# Patient Record
Sex: Female | Born: 1980 | Race: White | Hispanic: No | Marital: Married | Smoking: Former smoker
Health system: Southern US, Community
[De-identification: ages and names within clinical notes are randomized; demographics above are authoritative.]

## PROBLEM LIST (undated history)

## (undated) DIAGNOSIS — N12 Tubulo-interstitial nephritis, not specified as acute or chronic: Secondary | ICD-10-CM

## (undated) DIAGNOSIS — K429 Umbilical hernia without obstruction or gangrene: Secondary | ICD-10-CM

## (undated) DIAGNOSIS — F32A Depression, unspecified: Secondary | ICD-10-CM

## (undated) DIAGNOSIS — F329 Major depressive disorder, single episode, unspecified: Secondary | ICD-10-CM

## (undated) DIAGNOSIS — G44209 Tension-type headache, unspecified, not intractable: Secondary | ICD-10-CM

## (undated) HISTORY — PX: LASIK: SHX215

## (undated) HISTORY — DX: Umbilical hernia without obstruction or gangrene: K42.9

## (undated) HISTORY — PX: EYE SURGERY: SHX253

---

## 2006-01-02 ENCOUNTER — Ambulatory Visit: Payer: Self-pay | Admitting: Family Medicine

## 2006-01-30 ENCOUNTER — Ambulatory Visit: Payer: Self-pay | Admitting: Family Medicine

## 2006-04-03 ENCOUNTER — Ambulatory Visit: Payer: Self-pay | Admitting: Family Medicine

## 2006-04-03 LAB — CONVERTED CEMR LAB: Hemoglobin: 14.4 g/dL (ref 12.0–15.0)

## 2006-11-02 ENCOUNTER — Ambulatory Visit (HOSPITAL_COMMUNITY): Admission: RE | Admit: 2006-11-02 | Discharge: 2006-11-02 | Payer: Self-pay | Admitting: Internal Medicine

## 2006-11-09 ENCOUNTER — Other Ambulatory Visit: Admission: RE | Admit: 2006-11-09 | Discharge: 2006-11-09 | Payer: Self-pay | Admitting: Internal Medicine

## 2006-11-26 ENCOUNTER — Ambulatory Visit (HOSPITAL_COMMUNITY): Admission: RE | Admit: 2006-11-26 | Discharge: 2006-11-26 | Payer: Self-pay | Admitting: Internal Medicine

## 2006-11-28 ENCOUNTER — Emergency Department (HOSPITAL_COMMUNITY): Admission: EM | Admit: 2006-11-28 | Discharge: 2006-11-28 | Payer: Self-pay | Admitting: Emergency Medicine

## 2008-05-01 ENCOUNTER — Other Ambulatory Visit: Admission: RE | Admit: 2008-05-01 | Discharge: 2008-05-01 | Payer: Self-pay | Admitting: Internal Medicine
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
## 2010-10-17 LAB — RPR: RPR: NONREACTIVE

## 2010-10-17 LAB — ABO/RH: RH Type: POSITIVE

## 2010-10-17 LAB — GC/CHLAMYDIA PROBE AMP, GENITAL: Chlamydia: NEGATIVE

## 2010-10-17 LAB — CBC: Hemoglobin: 14.1 g/dL (ref 12.0–16.0)

## 2010-10-17 LAB — ANTIBODY SCREEN: Antibody Screen: NEGATIVE

## 2010-10-17 LAB — HIV ANTIBODY (ROUTINE TESTING W REFLEX): HIV: NONREACTIVE

## 2010-12-05 LAB — CBC
HCT: 42.3
Hemoglobin: 14.5
MCHC: 34.4
MCV: 90.5
Platelets: 265
RBC: 4.67
RDW: 13.3
WBC: 8.2

## 2010-12-05 LAB — RAPID URINE DRUG SCREEN, HOSP PERFORMED
Amphetamines: NOT DETECTED
Barbiturates: NOT DETECTED
Benzodiazepines: NOT DETECTED
Cocaine: POSITIVE — AB
Opiates: NOT DETECTED
Tetrahydrocannabinol: NOT DETECTED

## 2010-12-05 LAB — BASIC METABOLIC PANEL
BUN: 7
CO2: 27
Calcium: 8.9
Creatinine, Ser: 0.61
GFR calc Af Amer: >60
Glucose, Bld: 82
Sodium: 137

## 2010-12-05 LAB — DIFFERENTIAL
Basophils Absolute: 0
Basophils Relative: 0
Eosinophils Absolute: 0.1
Eosinophils Relative: 2
Lymphocytes Relative: 15
Lymphs Abs: 1.2
Monocytes Absolute: 0.4
Monocytes Relative: 5
Neutro Abs: 6.4
Neutrophils Relative %: 78 — ABNORMAL HIGH

## 2010-12-05 LAB — POCT CARDIAC MARKERS
CKMB, poc: <1 — ABNORMAL LOW
Myoglobin, poc: 44.9
Operator id: 2725
Troponin i, poc: <0.05

## 2010-12-05 LAB — BASIC METABOLIC PANEL WITH GFR
Chloride: 105
GFR calc non Af Amer: >60
Potassium: 3.9

## 2010-12-05 LAB — PREGNANCY, URINE: Preg Test, Ur: NEGATIVE

## 2010-12-05 LAB — D-DIMER, QUANTITATIVE: D-Dimer, Quant: 0.29

## 2011-02-25 DIAGNOSIS — K429 Umbilical hernia without obstruction or gangrene: Secondary | ICD-10-CM

## 2011-02-25 HISTORY — DX: Umbilical hernia without obstruction or gangrene: K42.9

## 2011-03-05 DIAGNOSIS — Z331 Pregnant state, incidental: Secondary | ICD-10-CM

## 2011-04-15 LAB — STREP B DNA PROBE: GBS: NEGATIVE

## 2011-04-21 ENCOUNTER — Encounter: Payer: Self-pay | Admitting: Registered Nurse

## 2011-04-23 ENCOUNTER — Encounter (INDEPENDENT_AMBULATORY_CARE_PROVIDER_SITE_OTHER): Payer: BC Managed Care – PPO | Admitting: Registered Nurse

## 2011-04-23 DIAGNOSIS — Z331 Pregnant state, incidental: Secondary | ICD-10-CM

## 2011-04-30 ENCOUNTER — Encounter (INDEPENDENT_AMBULATORY_CARE_PROVIDER_SITE_OTHER): Payer: BC Managed Care – PPO | Admitting: Registered Nurse

## 2011-04-30 DIAGNOSIS — Z331 Pregnant state, incidental: Secondary | ICD-10-CM

## 2011-05-08 ENCOUNTER — Encounter (INDEPENDENT_AMBULATORY_CARE_PROVIDER_SITE_OTHER): Payer: BC Managed Care – PPO

## 2011-05-08 DIAGNOSIS — Z331 Pregnant state, incidental: Secondary | ICD-10-CM

## 2011-05-15 ENCOUNTER — Encounter (INDEPENDENT_AMBULATORY_CARE_PROVIDER_SITE_OTHER): Payer: BC Managed Care – PPO | Admitting: Obstetrics and Gynecology

## 2011-05-15 DIAGNOSIS — Z331 Pregnant state, incidental: Secondary | ICD-10-CM

## 2011-05-18 ENCOUNTER — Inpatient Hospital Stay (HOSPITAL_COMMUNITY)
Admission: AD | Admit: 2011-05-18 | Discharge: 2011-05-19 | DRG: 373 | Disposition: A | Discharge status: Home / Self Care | Payer: BC Managed Care – PPO | Source: Ambulatory Visit | Attending: Obstetrics and Gynecology | Admitting: Obstetrics and Gynecology

## 2011-05-18 ENCOUNTER — Encounter (HOSPITAL_COMMUNITY): Payer: Self-pay

## 2011-05-18 DIAGNOSIS — IMO0001 Reserved for inherently not codable concepts without codable children: Secondary | ICD-10-CM

## 2011-05-18 HISTORY — DX: Depression, unspecified: F32.A

## 2011-05-18 HISTORY — DX: Tubulo-interstitial nephritis, not specified as acute or chronic: N12

## 2011-05-18 HISTORY — DX: Tension-type headache, unspecified, not intractable: G44.209

## 2011-05-18 HISTORY — DX: Major depressive disorder, single episode, unspecified: F32.9

## 2011-05-18 MED ORDER — OXYCODONE-ACETAMINOPHEN 5-325 MG PO TABS: 1.0000 | ORAL_TABLET | ORAL | Status: DC | PRN

## 2011-05-18 MED ORDER — BENZOCAINE-MENTHOL 20-0.5 % EX AERO
INHALATION_SPRAY | CUTANEOUS | Status: AC
  Filled 2011-05-18: qty 56

## 2011-05-18 MED ORDER — SENNOSIDES-DOCUSATE SODIUM 8.6-50 MG PO TABS: 2.0000 | ORAL_TABLET | Freq: Every day | ORAL | Status: DC

## 2011-05-18 MED ORDER — PRENATAL MULTIVITAMIN CH
1.0000 | ORAL_TABLET | Freq: Every day | ORAL | Status: DC
Start: 2011-05-18 — End: 2011-05-19
  Administered 2011-05-18: 1 via ORAL
  Filled 2011-05-18: qty 1

## 2011-05-18 MED ORDER — BENZOCAINE-MENTHOL 20-0.5 % EX AERO: 1.0000 "application " | INHALATION_SPRAY | CUTANEOUS | Status: DC | PRN

## 2011-05-18 MED ORDER — ONDANSETRON HCL 4 MG/2ML IJ SOLN: 4.0000 mg | INTRAMUSCULAR | Status: DC | PRN

## 2011-05-18 MED ORDER — LANOLIN HYDROUS EX OINT: TOPICAL_OINTMENT | CUTANEOUS | Status: DC | PRN

## 2011-05-18 MED ORDER — DIBUCAINE 1 % RE OINT: 1.0000 "application " | TOPICAL_OINTMENT | RECTAL | Status: DC | PRN

## 2011-05-18 MED ORDER — ZOLPIDEM TARTRATE 5 MG PO TABS: 5.0000 mg | ORAL_TABLET | Freq: Every evening | ORAL | Status: DC | PRN

## 2011-05-18 MED ORDER — OXYTOCIN 10 UNIT/ML IJ SOLN
INTRAMUSCULAR | Status: AC
  Administered 2011-05-18: 20 [IU]
  Filled 2011-05-18: qty 2

## 2011-05-18 MED ORDER — SIMETHICONE 80 MG PO CHEW: 80.0000 mg | CHEWABLE_TABLET | ORAL | Status: DC | PRN

## 2011-05-18 MED ORDER — WITCH HAZEL-GLYCERIN EX PADS: 1.0000 "application " | MEDICATED_PAD | CUTANEOUS | Status: DC | PRN

## 2011-05-18 MED ORDER — DIPHENHYDRAMINE HCL 25 MG PO CAPS: 25.0000 mg | ORAL_CAPSULE | Freq: Four times a day (QID) | ORAL | Status: DC | PRN

## 2011-05-18 MED ORDER — ONDANSETRON HCL 4 MG PO TABS: 4.0000 mg | ORAL_TABLET | ORAL | Status: DC | PRN

## 2011-05-18 MED ORDER — IBUPROFEN 600 MG PO TABS
600.0000 mg | ORAL_TABLET | Freq: Four times a day (QID) | ORAL | Status: DC
  Administered 2011-05-18 – 2011-05-19 (×4): 600 mg via ORAL
  Filled 2011-05-18 (×4): qty 1

## 2011-05-18 MED ORDER — TETANUS-DIPHTH-ACELL PERTUSSIS 5-2.5-18.5 LF-MCG/0.5 IM SUSP: 0.5000 mL | Freq: Once | INTRAMUSCULAR | Status: DC

## 2011-05-18 MED ORDER — LIDOCAINE HCL (PF) 1 % IJ SOLN
INTRAMUSCULAR | Status: AC
  Filled 2011-05-18: qty 30

## 2011-05-18 NOTE — H&P (Signed)
Holly Wright is a 31 y.o. female presenting for regular uterine contractions and SROM.  Pt presents with c/o SROM at 0200 3/24 with clear fluid and subsequent onset of regular uterine contractions shortly after.  Upon arrival to MAU, pt breathing well with UCs and involuntarily pushing with UCs.  Fetal vertex noted at vaginal introitus.    Maternal Medical History:  Reason for admission: Reason for admission: rupture of membranes and contractions.  Contractions: Onset was 3-5 hours ago.   Frequency: regular.   Perceived severity is moderate.    Fetal activity: Perceived fetal activity is normal.   Last perceived fetal movement was within the past hour.    Prenatal complications: no prenatal complications Prenatal Complications - Diabetes: none.   Hx Present Pregnancy: Pt initially established care at [redacted]wks gestation at [facility].  Pt underwent 1st trimester screening which returned with neg results.  Pt transferred care to [facility] at 29wks in order to pursue option for waterbirth.  Pt had abn 1hr glucola with value of 144.  Pt did undergo 3hr GTT but results are not available.  GBS screening negative at 35wks.  Remainder of prenatal course unremarkable.  OB History    Grav Para Term Preterm Abortions TAB SAB Ect Mult Living   1 1 1       1      Past Medical History  Diagnosis Date  . Pyelonephritis   . Tension headache   . Depression    Past Surgical History  Procedure Date  . Lasik    Family History: family history includes Cancer in her paternal grandfather; Heart disease in her maternal grandfather; and Hypertension in her mother. Social History:  reports that she quit smoking about 8 months ago. She has never used smokeless tobacco. She reports that she drinks alcohol. She reports that she uses illicit drugs (Marijuana). Last Marijuana use 4 mos prior to LMP per pt and only used ETOH socially prior to positive UPT.   Review of Systems  Constitutional:  Negative.   HENT: Negative.   Eyes: Negative.   Respiratory: Negative.   Cardiovascular: Negative.   Gastrointestinal: Negative.   Genitourinary: Negative.   Musculoskeletal: Negative.   Skin: Negative.   Neurological: Negative.   Endo/Heme/Allergies: Negative.   Psychiatric/Behavioral: Negative.     Dilation:  (appears to be complete, visualize fetal hair at perineum) Effacement (%):  (pt. unable to lay down to have sve) Blood pressure 121/81, pulse 97, temperature 98 F (36.7 C), temperature source Axillary, resp. rate 20, last menstrual period 08/12/2010, SpO2 98.00%, unknown if currently breastfeeding. Maternal Exam:  Uterine Assessment: Contraction strength is firm.  Contraction frequency is regular.   Abdomen: Patient reports no abdominal tenderness. Fundal height is 40.   Estimated fetal weight is 7.   Fetal presentation: vertex  Introitus: Normal vulva. Normal vagina.  Ferning test: not done.  Nitrazine test: not done. Amniotic fluid character: clear.  Pelvis: adequate for delivery.      Fetal Exam Fetal Monitor Review: Mode: ultrasound.   Baseline rate: 130.  Variability: minimal (<5 bpm).   Pattern: no accelerations.    Fetal State Assessment: Category II - tracings are indeterminate.    Unable to obtain continuous EFM tracing due to pt position (hands/knees) Physical Exam  Constitutional: She is oriented to person, place, and time. She appears well-developed and well-nourished.  HENT:  Head: Normocephalic and atraumatic.  Right Ear: External ear normal.  Left Ear: External ear normal.  Nose: Nose normal.  Eyes: Conjunctivae are normal. Pupils are equal, round, and reactive to light.  Neck: Normal range of motion. Neck supple. No thyromegaly present.  Cardiovascular: Normal rate, regular rhythm and intact distal pulses.   Respiratory: Effort normal and breath sounds normal.  GI: Soft. Bowel sounds are normal.  Genitourinary: Vagina normal and uterus  normal.  Musculoskeletal: Normal range of motion.  Neurological: She is alert and oriented to person, place, and time. She has normal reflexes.  Skin: Skin is warm and dry.  Psychiatric: She has a normal mood and affect. Her behavior is normal.    Prenatal labs: ABO, Rh: B/Positive/-- (08/23 0000) Antibody: Negative (08/23 0000) Rubella: Immune (08/23 0000) RPR: Nonreactive (08/23 0000)  HBsAg: Negative (08/23 0000)  HIV: Non-reactive (08/23 0000)  GBS: Negative (02/19 0000)   Assessment/Plan: IUP at [redacted]w[redacted]d 2nd stage of labor  Pt delivered in MAU.  See Delivery record.   Admit to Mother-Baby from MAU. Routine post-partum admission orders.     Isom Kochan O. 05/18/2011, 9:46 PM

## 2011-05-18 NOTE — Progress Notes (Signed)
Patient ID: Holly Wright, female   DOB: January 03, 1981, 31 y.o.   MRN: [id number] Delivery Note At 5:06 AM a viable female was delivered via Vaginal, Spontaneous Delivery (Presentation: LOA;Vertex). Pt on hands and knees.  No nuchal cord noted.  No difficulty with shoulders.  Infant with spontaneous lusty cry.  Infant dried and placed on maternal chest after pt resumed semifowlers position.  Cord doubly clamped after cessation of pulsation (pt request) and cord cut by FOB.    APGAR: 8, 9; weight 7 lb 3 oz (3260 g).   Placenta status: Intact, Spontaneous.  Cord: 3 vessels with the following complications: None.  Cord pH: Not obtained.  Anesthesia: 1% xylocaine local Episiotomy: None Lacerations: 1st degree bilateral labial lacerations Suture Repair: 3.0 vicryl Est. Blood Loss (mL): 350  Mom to postpartum.  Baby to nursery-stable.  Donn Zanetti O. 05/18/2011, 10:03 PM
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
## 2011-05-18 NOTE — Progress Notes (Addendum)
Patient's doula has came in before her to register her in. Patient arrived at 0500am and was brought straight in to rm 8. She is on hands and knees and pushing, blood and stool at her perineum. Nona smith cnm called to attend delivery. fht attained intermittently, unable to trace contraction with toco. Palpated about every 1-16minutes apart. Patient states that she ruptured her membranes at 0200am with clear fluids. She was unable to answer history questions at this time. She states that this pregnancy was uncomplicated, have intended to do a waterbirth, will breastfeed and wants no medications.

## 2011-05-18 NOTE — Progress Notes (Signed)
Ist degree lacerations repair by Elsie Ra cnm

## 2011-05-19 LAB — CBC
HCT: 32.7 % — ABNORMAL LOW (ref 36.0–46.0)
Hemoglobin: 10.9 g/dL — ABNORMAL LOW (ref 12.0–15.0)
MCHC: 33.3 g/dL (ref 30.0–36.0)

## 2011-05-19 MED ORDER — IBUPROFEN 600 MG PO TABS: 600.0000 mg | ORAL_TABLET | Freq: Four times a day (QID) | ORAL | Status: AC | PRN

## 2011-05-19 NOTE — Discharge Instructions (Signed)
Breastfeeding BENEFITS OF BREASTFEEDING For the baby  The first milk (colostrum) helps the baby's digestive system function better.   There are antibodies from the mother in the milk that help the baby fight off infections.   The baby has a lower incidence of asthma, allergies, and SIDS (sudden infant death syndrome).   The nutrients in breast milk are better than formulas for the baby and helps the baby's brain grow better.   Babies who breastfeed have less gas, colic, and constipation.  For the mother  Breastfeeding helps develop a very special bond between mother and baby.   It is more convenient, always available at the correct temperature and cheaper than formula feeding.   It burns calories in the mother and helps with losing weight that was gained during pregnancy.  Vaginal Delivery Care After Change your pad on each trip to the bathroom.  Wipe gently with toilet paper during your hospital stay. Always wipe from front to back. A spray bottle with warm tap water could also be used or a towelette if available.  Place your soiled pad and toilet paper in a bathroom wastebasket with a plastic bag liner.  During your hospital stay, save any clots. If you pass a clot while on the toilet, do not flush it. Also, if your vaginal flow seems excessive to you, notify nursing personnel.  The first time you get out of bed after delivery, wait for assistance from a nurse. Do not get up alone at any time if you feel weak or dizzy.  Bend and extend your ankles forcefully so that you feel the calves of your legs get hard. Do this 6 times every hour when you are in bed and awake.  Do not sit with one foot under you, dangle your legs over the edge of the bed, or maintain a position that hinders the circulation in your legs.  Many women experience after pains for 2 to 3 days after delivery. These after pains are mild uterine contractions. Ask the nurse for a pain medication if you need something for  this. Sometimes breastfeeding stimulates after pains; if you find this to be true, ask for the medication  -  hour before the next feeding.  For you and your infant's protection, do not go beyond the door(s) of the obstetric unit. Do not carry your baby in your arms in the hallway. When taking your baby to and from your room, put your baby in the bassinet and push the bassinet.  Mothers may have their babies in their room as much as they desire.  Document Released: 02/08/2000 Document Revised: 01/30/2011 Document Reviewed: 01/08/2007 [facility] Patient Information 2012 [place], [place].  It makes the uterus contract back down to normal size faster and slows bleeding following delivery.   Breastfeeding mothers have a lower risk of developing breast cancer.  NURSE FREQUENTLY  A healthy, full-term baby may breastfeed as often as every hour or space his or her feedings to every 3 hours.   How often to nurse will vary from baby to baby. Watch your baby for signs of hunger, not the clock.   Nurse as often as the baby requests, or when you feel the need to reduce the fullness of your breasts.   Awaken the baby if it has been 3 to 4 hours since the last feeding.   Frequent feeding will help the mother make more milk and will prevent problems like sore nipples and engorgement of the breasts.  BABY'S POSITION  AT THE BREAST  Whether lying down or sitting, be sure that the baby's tummy is facing your tummy.   Support the breast with 4 fingers underneath the breast and the thumb above. Make sure your fingers are well away from the nipple and baby's mouth.   Stroke the baby's lips and cheek closest to the breast gently with your finger or nipple.   When the baby's mouth is open wide enough, place all of your nipple and as much of the dark area around the nipple as possible into your baby's mouth.   Pull the baby in close so the tip of the nose and the baby's cheeks touch the breast during the feeding.   FEEDINGS  The length of each feeding varies from baby to baby and from feeding to feeding.   The baby must suck about 2 to 3 minutes for your milk to get to him or her. This is called a "let down." For this reason, allow the baby to feed on each breast as long as he or she wants. Your baby will end the feeding when he or she has received the right balance of nutrients.   To break the suction, put your finger into the corner of the baby's mouth and slide it between his or her gums before removing your breast from his or her mouth. This will help prevent sore nipples.  REDUCING BREAST ENGORGEMENT  In the first week after your baby is born, you may experience signs of breast engorgement. When breasts are engorged, they feel heavy, warm, full, and may be tender to the touch. You can reduce engorgement if you:   Nurse frequently, every 2 to 3 hours. Mothers who breastfeed early and often have fewer problems with engorgement.   Place light ice packs on your breasts between feedings. This reduces swelling. Wrap the ice packs in a lightweight towel to protect your skin.   Apply moist hot packs to your breast for 5 to 10 minutes before each feeding. This increases circulation and helps the milk flow.   Gently massage your breast before and during the feeding.   Make sure that the baby empties at least one breast at every feeding before switching sides.   Use a breast pump to empty the breasts if your baby is sleepy or not nursing well. You may also want to pump if you are returning to work or or you feel you are getting engorged.   Avoid bottle feeds, pacifiers or supplemental feedings of water or juice in place of breastfeeding.   Be sure the baby is latched on and positioned properly while breastfeeding.   Prevent fatigue, stress, and anemia.   Wear a supportive bra, avoiding underwire styles.   Eat a balanced diet with enough fluids.  If you follow these suggestions, your engorgement  should improve in 24 to 48 hours. If you are still experiencing difficulty, call your lactation consultant or caregiver. IS MY BABY GETTING ENOUGH MILK? Sometimes, mothers worry about whether their babies are getting enough milk. You can be assured that your baby is getting enough milk if:  The baby is actively sucking and you hear swallowing.   The baby nurses at least 8 to 12 times in a 24 hour time period. Nurse your baby until he or she unlatches or falls asleep at the first breast (at least 10 to 20 minutes), then offer the second side.   The baby is wetting 5 to 6 disposable diapers (6 to 8  cloth diapers) in a 24 hour period by 4 to 55 days of age.   The baby is having at least 2 to 3 stools every 24 hours for the first few months. Breast milk is all the food your baby needs. It is not necessary for your baby to have water or formula. In fact, to help your breasts make more milk, it is best not to give your baby supplemental feedings during the early weeks.   The stool should be soft and yellow.   The baby should gain 4 to 7 ounces per week after he is 61 days old.  TAKE CARE OF YOURSELF Take care of your breasts by:  Bathing or showering daily.   Avoiding the use of soaps on your nipples.   Start feedings on your left breast at one feeding and on your right breast at the next feeding.   You will notice an increase in your milk supply 2 to 5 days after delivery. You may feel some discomfort from engorgement, which makes your breasts very firm and often tender. Engorgement "peaks" out within 24 to 48 hours. In the meantime, apply warm moist towels to your breasts for 5 to 10 minutes before feeding. Gentle massage and expression of some milk before feeding will soften your breasts, making it easier for your baby to latch on. Wear a well fitting nursing bra and air dry your nipples for 10 to 15 minutes after each feeding.   Only use cotton bra pads.   Only use pure lanolin on your  nipples after nursing. You do not need to wash it off before nursing.  Take care of yourself by:   Eating well-balanced meals and nutritious snacks.   Drinking milk, fruit juice, and water to satisfy your thirst (about 8 glasses a day).   Getting plenty of rest.   Increasing calcium in your diet (1200 mg a day).   Avoiding foods that you notice affect the baby in a bad way.  SEEK MEDICAL CARE IF:   You have any questions or difficulty with breastfeeding.   You need help.   You have a hard, red, sore area on your breast, accompanied by a fever of 100.5 F (38.1 C) or more.   Your baby is too sleepy to eat well or is having trouble sleeping.   Your baby is wetting less than 6 diapers per day, by 51 days of age.   Your baby's skin or white part of his or her eyes is more yellow than it was in the hospital.   You feel depressed.  Document Released: 02/10/2005 Document Revised: 01/30/2011 Document Reviewed: 09/25/2008 [facility] Patient Information 2012 [place], [place].

## 2011-05-19 NOTE — Discharge Summary (Signed)
Obstetric Discharge Summary Reason for Admission: onset of labor Prenatal Procedures: ultrasound Intrapartum Procedures: spontaneous vaginal delivery Postpartum Procedures: none Complications-Operative and Postpartum: 1  degree perineal laceration Hemoglobin  Date Value Range Status  05/19/2011 10.9* 12.0-15.0 (g/dL) Final     HCT  Date Value Range Status  05/19/2011 32.7* 36.0-46.0 (%) Final   Hospital course: term pg active labor, SVD, normal involution, breastfeeding  Physical Exam:  General: alert, cooperative and no distress Lochia: appropriate Uterine Fundus: firm Incision: healing well DVT Evaluation: Negative Homan's sign. Results for orders placed during the hospital encounter of 05/18/11 (from the past 72 hour(s))  CBC     Status: Abnormal   Collection Time   05/19/11  5:25 AM      Component Value Range Comment   WBC 13.3 (*) 4.0 - 10.5 (K/uL)    RBC 3.74 (*) 3.87 - 5.11 (MIL/uL)    Hemoglobin 10.9 (*) 12.0 - 15.0 (g/dL)    HCT 40.9 (*) 81.1 - 46.0 (%)    MCV 87.4  78.0 - 100.0 (fL)    MCH 29.1  26.0 - 34.0 (pg)    MCHC 33.3  30.0 - 36.0 (g/dL)    RDW 91.4  78.2 - 95.6 (%)    Platelets 135 (*) 150 - 400 (K/uL)    Discharge Diagnoses: Term Pregnancy-delivered  Discharge Information: Date: 05/19/2011 Activity: pelvic rest Diet: routine Medications: PNV and Ibuprofen Condition: stable Instructions: refer to practice specific booklet Discharge to: home Follow-up Information    Follow up with [facility] in 6 weeks.         Newborn Data: Live born female  Birth Weight: 7 lb 3 oz (3260 g) APGAR: 8, 9  Home with mother.  KREBSBACH, MARY 05/19/2011, 8:53 AM

## 2011-05-19 NOTE — Progress Notes (Signed)
Discussion held with parents concerning requested circumcision with that as the indication for the procedure.  Risks and steps of the procedure reviewed, and they wish to proceed and have an early discharge home thereafter.

## 2011-05-20 ENCOUNTER — Encounter: Payer: BC Managed Care – PPO | Admitting: Obstetrics and Gynecology

## 2011-07-01 ENCOUNTER — Ambulatory Visit (INDEPENDENT_AMBULATORY_CARE_PROVIDER_SITE_OTHER): Payer: BC Managed Care – PPO | Admitting: Obstetrics and Gynecology

## 2011-07-01 VITALS — BP 102/68 | Temp 98.2°F | Resp 16 | Ht 65.0 in | Wt 143.0 lb

## 2011-07-01 DIAGNOSIS — Z3009 Encounter for other general counseling and advice on contraception: Secondary | ICD-10-CM

## 2011-07-01 DIAGNOSIS — Z309 Encounter for contraceptive management, unspecified: Secondary | ICD-10-CM

## 2011-07-01 DIAGNOSIS — IMO0002 Reserved for concepts with insufficient information to code with codable children: Secondary | ICD-10-CM

## 2011-07-01 DIAGNOSIS — O228X9 Other venous complications in pregnancy, unspecified trimester: Secondary | ICD-10-CM

## 2011-07-01 MED ORDER — HYDROCORTISONE ACE-PRAMOXINE 1-1 % RE FOAM: 1.0000 | Freq: Two times a day (BID) | RECTAL | Status: AC

## 2011-07-01 MED ORDER — NORETHINDRONE 0.35 MG PO TABS: 1.0000 | ORAL_TABLET | Freq: Every day | ORAL | Status: DC

## 2011-07-01 NOTE — Progress Notes (Signed)
Subjective:     Holly Wright is a 31 y.o. female who presents for a postpartum visit.  I have fully reviewed the prenatal and intrapartum course.   She had a rapid labor (particularly for a 1st baby!), and delivered in MAU with Elsie Ra.  Had 1st degree bilateral labial lacerations.  PPDS = 6, but patient denies depression.  Breastfeeding going well.  Plans Micronor.  Interested in another baby within next 2 years or so.   Patient is not sexually active yet.  The following portions of the patient's history were reviewed and updated as appropriate: allergies, current medications, past family history, past medical history, past social history, past surgical history and problem list.  Review of Systems Pertinent items are noted in HPI.   Objective:    BP 102/68  Temp 98.2 F (36.8 C)  Resp 16  Ht 5\' 5"  (1.651 m)  Wt 143 lb (64.864 kg)  BMI 23.80 kg/m2  Breastfeeding? Yes  General:  alert, cooperative and no distress     Lungs: clear to auscultation bilaterally  Heart:  regular rate and rhythm, S1, S2 normal, no murmur  Abdomen: soft, non-tender; bowel sounds normal; no masses,  no organomegaly   Vulva:  normal  Vagina: normal vagina  Cervix:  normal  Corpus: normal size, contour, position, consistency, mobility, non-tender  Adnexa:  normal adnexa             Assessment:     Normal postpartum exam.  Pap smear not done at today's visit. Due August 2013  Plan:     1. Contraception: oral progesterone-only contraceptive.  Rx Micronor given today.   2.. Follow up in: 3 months for pap in August, 2013, or as needed.    Nigel Bridgeman, CNM, [place] 07/01/2011 11:01 AM

## 2011-07-01 NOTE — Progress Notes (Signed)
Patient ID: Holly Wright, female   DOB: 1980/09/24, 31 y.o.   MRN: [id number] Date of delivery: 05/18/2011 Female Name: Holly Wright Vaginal delivery:yes Cesarean section:no Tubal ligation:no GDM:no Breast Feeding:yes Bottle Feeding:no Post-Partum Blues:no Abnormal pap:no Normal GU function: yes Normal GI function:yes Returning to work:no; will not be returning back to work  Pt states that she is still having some issues with her hemorrhoids  Wants to discuss birth control today  EPDS- 6

## 2011-10-08 ENCOUNTER — Other Ambulatory Visit: Payer: Self-pay | Admitting: Obstetrics and Gynecology

## 2011-10-08 NOTE — Telephone Encounter (Signed)
KESHIA/RX

## 2011-10-08 NOTE — Telephone Encounter (Signed)
Tc to pt regarding msg.  Lm on vm to call back. 

## 2012-03-18 ENCOUNTER — Ambulatory Visit: Payer: BC Managed Care – PPO | Admitting: Obstetrics and Gynecology

## 2012-04-01 ENCOUNTER — Encounter: Payer: Self-pay | Admitting: Obstetrics and Gynecology

## 2012-04-01 ENCOUNTER — Ambulatory Visit: Payer: Medicare PPO | Admitting: Obstetrics and Gynecology

## 2012-04-01 VITALS — BP 100/62 | Resp 18 | Wt 133.0 lb

## 2012-04-01 DIAGNOSIS — Z124 Encounter for screening for malignant neoplasm of cervix: Secondary | ICD-10-CM

## 2012-04-01 DIAGNOSIS — Z87448 Personal history of other diseases of urinary system: Secondary | ICD-10-CM

## 2012-04-01 DIAGNOSIS — Z8659 Personal history of other mental and behavioral disorders: Secondary | ICD-10-CM

## 2012-04-01 DIAGNOSIS — K429 Umbilical hernia without obstruction or gangrene: Secondary | ICD-10-CM

## 2012-04-01 NOTE — Progress Notes (Signed)
Subjective:   Subjective:    Holly Wright is a 32 y.o. female, G1P1001, who presents for an annual exam.   Patient reports:  Doing well.  Still breastfeeding 80 month old son.  Considering conception in next year, but plans to continue OCPs at present.  Doesn't need refill on Rx at present.  Huband and son are with her at today's visit.  Just had 1st cycle since delivery.  Had hx of fairly rapid labor with 1st baby--interested in my perspective on home birth.  Had recent evaluation for periumbilical hernia by surgical PA--no need at present for any surgical correction.  Only has occasional tenderness in area, no hx incarceration.    History   Social History  . Marital Status: Married    Spouse Name: N/A    Number of Children: N/A  . Years of Education: N/A   Social History Main Topics  . Smoking status: Former Smoker    Quit date: 09/11/2010  . Smokeless tobacco: Never Used  . Alcohol Use: Yes     Comment: Occas social use  . Drug Use: No     Comment: Last use 4/12 and occas at that time  . Sexually Active: Yes -- Female partner(s)    Birth Control/ Protection: Pill     Comment: orthomicronor    Other Topics Concern  . None   Social History Narrative  . None    Menstrual cycle:   LMP: Patient's last menstrual period was 03/22/2012.           Cycle:  Just had 1st cycle since delivery  The following portions of the patient's history were reviewed and updated as appropriate: allergies, current medications, past family history, past medical history, past social history, past surgical history and problem list.  Review of Systems Pertinent items are noted in HPI. Breast:Negative for breast lump,nipple discharge or nipple retraction Gastrointestinal: Negative for abdominal pain, change in bowel habits or rectal bleeding Urinary:negative   Objective:    BP 100/62  Resp 18  Wt 133 lb (60.328 kg)  LMP 03/22/2012  Breastfeeding? Yes    Weight:  Wt Readings from Last 1  Encounters:  04/01/12 133 lb (60.328 kg)          BMI: There is no height on file to calculate BMI.  General Appearance: Alert, appropriate appearance for age. No acute distress HEENT: Grossly normal Neck / Thyroid: Supple, no masses, nodes or enlargement Lungs: clear to auscultation bilaterally Back: No CVA tenderness Breast Exam: No masses or nodes.No dimpling, nipple retraction or discharge. Cardiovascular: Regular rate and rhythm. S1, S2, no murmur Gastrointestinal: Soft, non-tender, no masses or organomegaly.  Approx 2 cm diastasis recti noted, with small periumbilical hernia to right of midline (approx 1-2 cm area--NT, no evidence of incarceration. Pelvic Exam: Vulva and vagina appear normal. Bimanual exam reveals normal uterus and adnexa. Rectovaginal: not indicated Lymphatic Exam: Non-palpable nodes in neck, clavicular, axillary, or inguinal regions Skin: no rash or abnormalities Neurologic: Normal gait and speech, no tremor  Psychiatric: Alert and oriented, appropriate affect.   Wet Prep:not applicable Urinalysis:not applicable UPT: Not done   Assessment:    Normal gyn exam  Considering conception in next year  Periumbilical hernia/diastasis recti--stable. Plan:    Mammogram: Age 59 Pap:  Done today STD screening: declined Contraception:oral progesterone-only contraceptive Other:  Will continue PNV. To call if needs refill on OCP. Reviewed preconception issues. Discussed monitoring of hernia status now and during future pregnancy.  No intervention  needed unless acute issue occurs.  Discussed issues around home birth, including risks, need to ensure appropriate provider attendant, care by [facility] if has to present to hospital, option of [facility] providing care until approx 28-30 weeks, then transferring to home birth provider, etc.  Patient will continue to consider the issue and inform us in the future if that would be her plan. Pap in 3 years.    Nyra Capes, [place]

## 2012-04-01 NOTE — Progress Notes (Signed)
The patient reports:no complaints  Contraception:oral contraceptives (estrogen/progesterone)  Last mammogram: never  Last pap: was normal and not applicable August  2012  GC/Chlamydia cultures offered: declined HIV/RPR/HbsAg offered:  declined HSV 1 and 2 glycoprotein offered: declined  Menstrual cycle regular and monthly: Yes Menstrual flow normal: Yes  Urinary symptoms: none Normal bowel movements: Yes Reports abuse at home: No

## 2012-04-02 DIAGNOSIS — K429 Umbilical hernia without obstruction or gangrene: Secondary | ICD-10-CM | POA: Insufficient documentation

## 2012-04-02 DIAGNOSIS — Z87448 Personal history of other diseases of urinary system: Secondary | ICD-10-CM | POA: Insufficient documentation

## 2012-04-02 DIAGNOSIS — Z8659 Personal history of other mental and behavioral disorders: Secondary | ICD-10-CM | POA: Insufficient documentation

## 2012-04-02 LAB — PAP IG W/ RFLX HPV ASCU

## 2012-08-12 LAB — OB RESULTS CONSOLE RPR: RPR: NONREACTIVE

## 2012-08-12 LAB — OB RESULTS CONSOLE ABO/RH: RH Type: POSITIVE

## 2012-08-12 LAB — OB RESULTS CONSOLE HIV ANTIBODY (ROUTINE TESTING): HIV: NONREACTIVE

## 2012-08-12 LAB — OB RESULTS CONSOLE ANTIBODY SCREEN: Antibody Screen: NEGATIVE

## 2012-08-12 LAB — OB RESULTS CONSOLE RUBELLA ANTIBODY, IGM: Rubella: NON-IMMUNE/NOT IMMUNE

## 2012-08-12 LAB — OB RESULTS CONSOLE HEPATITIS B SURFACE ANTIGEN: Hepatitis B Surface Ag: NEGATIVE

## 2013-02-24 NOTE — L&D Delivery Note (Signed)
Delivery Note Pt progressed quickly with change from 4.5cm to 10cm in 29 mins  At 5:00 AM a viable female was delivered via Vaginal, Spontaneous Delivery (Presentation: ; Occiput Anterior).  No nuchal cord.  No difficulty with shoulders.  Cord doubly clamped after cessation of pulsation and cord cut by FOB.   APGAR: 9, 9; weight 6 lb 14.9 oz (3144 g).   Placenta status: Intact, Spontaneous.  Cord: 3 vessels with the following complications: None.  Cord pH: N/A  Anesthesia: None  Episiotomy: None Lacerations: 1st degree-hemostatic and not repaired Suture Repair: N/A Est. Blood Loss (mL): 250  Mom to postpartum.  Baby to Couplet care / Skin to Skin.  Elwin Tsou O. 02/27/2013, 7:23 AM

## 2013-02-27 ENCOUNTER — Encounter (HOSPITAL_COMMUNITY): Payer: Self-pay

## 2013-02-27 ENCOUNTER — Inpatient Hospital Stay (HOSPITAL_COMMUNITY)
Admission: AD | Admit: 2013-02-27 | Discharge: 2013-02-28 | DRG: 775 | Disposition: A | Discharge status: Home / Self Care | Payer: BC Managed Care – PPO | Source: Ambulatory Visit | Attending: Obstetrics and Gynecology | Admitting: Obstetrics and Gynecology

## 2013-02-27 DIAGNOSIS — IMO0001 Reserved for inherently not codable concepts without codable children: Secondary | ICD-10-CM

## 2013-02-27 MED ORDER — OXYTOCIN 10 UNIT/ML IJ SOLN
INTRAMUSCULAR | Status: AC
  Administered 2013-02-27: 06:00:00 20 [IU] via INTRAMUSCULAR
  Filled 2013-02-27: qty 2

## 2013-02-27 MED ORDER — OXYCODONE-ACETAMINOPHEN 5-325 MG PO TABS: 1.0000 | ORAL_TABLET | ORAL | Status: DC | PRN

## 2013-02-27 MED ORDER — ONDANSETRON HCL 4 MG PO TABS: 4.0000 mg | ORAL_TABLET | ORAL | Status: DC | PRN

## 2013-02-27 MED ORDER — OXYTOCIN 40 UNITS IN LACTATED RINGERS INFUSION - SIMPLE MED
62.5000 mL/h | INTRAVENOUS | Status: DC
  Filled 2013-02-27: qty 1000

## 2013-02-27 MED ORDER — BENZOCAINE-MENTHOL 20-0.5 % EX AERO
1.0000 "application " | INHALATION_SPRAY | CUTANEOUS | Status: DC | PRN
  Administered 2013-02-27: 1 via TOPICAL
  Filled 2013-02-27: qty 56

## 2013-02-27 MED ORDER — ZOLPIDEM TARTRATE 5 MG PO TABS: 5.0000 mg | ORAL_TABLET | Freq: Every evening | ORAL | Status: DC | PRN

## 2013-02-27 MED ORDER — CITRIC ACID-SODIUM CITRATE 334-500 MG/5ML PO SOLN: 30.0000 mL | ORAL | Status: DC | PRN

## 2013-02-27 MED ORDER — TETANUS-DIPHTH-ACELL PERTUSSIS 5-2.5-18.5 LF-MCG/0.5 IM SUSP: 0.5000 mL | Freq: Once | INTRAMUSCULAR | Status: DC

## 2013-02-27 MED ORDER — LANOLIN HYDROUS EX OINT: TOPICAL_OINTMENT | CUTANEOUS | Status: DC | PRN

## 2013-02-27 MED ORDER — DIPHENHYDRAMINE HCL 25 MG PO CAPS: 25.0000 mg | ORAL_CAPSULE | Freq: Four times a day (QID) | ORAL | Status: DC | PRN

## 2013-02-27 MED ORDER — LACTATED RINGERS IV SOLN: 500.0000 mL | INTRAVENOUS | Status: DC | PRN

## 2013-02-27 MED ORDER — DIBUCAINE 1 % RE OINT: 1.0000 "application " | TOPICAL_OINTMENT | RECTAL | Status: DC | PRN

## 2013-02-27 MED ORDER — OXYTOCIN BOLUS FROM INFUSION: 500.0000 mL | INTRAVENOUS | Status: DC

## 2013-02-27 MED ORDER — ACETAMINOPHEN 325 MG PO TABS: 650.0000 mg | ORAL_TABLET | ORAL | Status: DC | PRN

## 2013-02-27 MED ORDER — ONDANSETRON HCL 4 MG/2ML IJ SOLN: 4.0000 mg | INTRAMUSCULAR | Status: DC | PRN

## 2013-02-27 MED ORDER — FLEET ENEMA 7-19 GM/118ML RE ENEM: 1.0000 | ENEMA | RECTAL | Status: DC | PRN

## 2013-02-27 MED ORDER — ONDANSETRON HCL 4 MG/2ML IJ SOLN: 4.0000 mg | Freq: Four times a day (QID) | INTRAMUSCULAR | Status: DC | PRN

## 2013-02-27 MED ORDER — WITCH HAZEL-GLYCERIN EX PADS: 1.0000 "application " | MEDICATED_PAD | CUTANEOUS | Status: DC | PRN

## 2013-02-27 MED ORDER — OXYTOCIN 10 UNIT/ML IJ SOLN
20.0000 [IU] | Freq: Once | INTRAMUSCULAR | Status: AC
  Administered 2013-02-27: 20 [IU] via INTRAMUSCULAR

## 2013-02-27 MED ORDER — SENNOSIDES-DOCUSATE SODIUM 8.6-50 MG PO TABS
2.0000 | ORAL_TABLET | ORAL | Status: DC
  Administered 2013-02-28: 2 via ORAL
  Filled 2013-02-27: qty 2

## 2013-02-27 MED ORDER — METHYLERGONOVINE MALEATE 0.2 MG/ML IJ SOLN
INTRAMUSCULAR | Status: AC
  Administered 2013-02-27: 07:00:00 0.2 mg via INTRAMUSCULAR
  Filled 2013-02-27: qty 1

## 2013-02-27 MED ORDER — SIMETHICONE 80 MG PO CHEW
80.0000 mg | CHEWABLE_TABLET | ORAL | Status: DC | PRN
  Administered 2013-02-27: 80 mg via ORAL
  Filled 2013-02-27: qty 1

## 2013-02-27 MED ORDER — IBUPROFEN 600 MG PO TABS
600.0000 mg | ORAL_TABLET | Freq: Four times a day (QID) | ORAL | Status: DC | PRN
  Administered 2013-02-27: 600 mg via ORAL
  Filled 2013-02-27: qty 1

## 2013-02-27 MED ORDER — PRENATAL MULTIVITAMIN CH
1.0000 | ORAL_TABLET | Freq: Every day | ORAL | Status: DC
  Administered 2013-02-27: 1 via ORAL
  Filled 2013-02-27: qty 1

## 2013-02-27 MED ORDER — IBUPROFEN 600 MG PO TABS
600.0000 mg | ORAL_TABLET | Freq: Four times a day (QID) | ORAL | Status: DC
  Administered 2013-02-27 – 2013-02-28 (×4): 600 mg via ORAL
  Filled 2013-02-27 (×4): qty 1

## 2013-02-27 MED ORDER — METHYLERGONOVINE MALEATE 0.2 MG/ML IJ SOLN
0.2000 mg | Freq: Once | INTRAMUSCULAR | Status: AC
  Administered 2013-02-27: 0.2 mg via INTRAMUSCULAR

## 2013-02-27 MED ORDER — LIDOCAINE HCL (PF) 1 % IJ SOLN
30.0000 mL | INTRAMUSCULAR | Status: DC | PRN
  Filled 2013-02-27 (×2): qty 30

## 2013-02-27 NOTE — H&P (Signed)
Holly Wright is a 33 y.o. female presenting for regular uterine contractions and SROM at 40w 0d. History Pt presents with c/o contractions which began after SROM at approx 2:50AM today.  Reports clear fluid.  Denies bldg.  Reports normal fetal activity.  Continues to leak clear fluid.  Pt planning waterbirth. OB History   Grav Para Term Preterm Abortions TAB SAB Ect Mult Living   2 1 1       1      Hx Present Pregnancy: Pt followed by [facility] throughout pregnancy.  Pt entered prenatal care at [redacted]wks gestation.  Pt with neg 1st trim screen.  She declined MSAFP.  US for anatomy at 18wks with syncechiae noted and limited heart/facial views.  Repeat US at 24wks with contd synechiae and EFW at 22%ile.  Repeat ultrasound at 27wks with EFW 22Tile.  Normal fetal growth on repeat US at 31wks.  Pt with normal 1hr GTT and neg GBS. No other complications during prenatal course.  Past Medical History  Diagnosis Date  . Pyelonephritis   . Tension headache   . Depression   . Umbilical hernia 2013   Past Surgical History  Procedure Laterality Date  . Lasik     Family History: family history includes Cancer in her father, maternal grandmother, and paternal grandfather; Heart disease in her maternal grandfather; Hypertension in her mother. Social History:  reports that she quit smoking about 2 years ago. She has never used smokeless tobacco. She reports that she drinks alcohol. She reports that she does not use illicit drugs.   Prenatal Transfer Tool  Maternal Diabetes: No Genetic Screening: Normal Maternal Ultrasounds/Referrals: Normal Fetal Ultrasounds or other Referrals:  None Maternal Substance Abuse:  No Significant Maternal Medications:  None Significant Maternal Lab Results:  None Other Comments:  None  Review of Systems  Constitutional: Negative.   HENT: Negative.   Eyes: Negative.   Respiratory: Negative.   Cardiovascular: Negative.   Gastrointestinal: Negative.   Genitourinary:  Negative.   Musculoskeletal: Negative.   Skin: Negative.   Neurological: Negative.   Endo/Heme/Allergies: Negative.   Psychiatric/Behavioral: Negative.     Dilation: 4.5 Effacement (%): 90 Station: 0 Exam by:: Airon Sahni CNM   Maternal Exam:  Uterine Assessment: Contraction strength is firm.  Contraction frequency is regular.  UCs every 4 mins  Abdomen: Fundal height is 38.   Estimated fetal weight is 7#.   Fetal presentation: vertex  Introitus: Normal vulva. Ferning test: not done.  Nitrazine test: not done. Amniotic fluid character: clear. Sm amt of clear amniotic fluid noted from vagina.  Pelvis: adequate for delivery.   Cervix: Cervix evaluated by digital exam.     Fetal Exam Fetal Monitor Review: Mode: ultrasound.   Baseline rate: 125.  Variability: moderate (6-25 bpm).   Pattern: accelerations present and no decelerations.    Fetal State Assessment: Category I - tracings are normal.     Physical Exam  Nursing note and vitals reviewed. Constitutional: She is oriented to person, place, and time. She appears well-developed and well-nourished.  HENT:  Head: Normocephalic and atraumatic.  Left Ear: External ear normal.  Nose: Nose normal.  Eyes: Conjunctivae are normal. Pupils are equal, round, and reactive to light.  Neck: Normal range of motion. Neck supple.  Cardiovascular: Normal rate, regular rhythm and intact distal pulses.   Respiratory: Effort normal and breath sounds normal.  GI: Soft. Bowel sounds are normal. There is no tenderness.  Gravid  Musculoskeletal: Normal range of motion.  Neurological: She  is alert and oriented to person, place, and time. She has normal reflexes.  Skin: Skin is warm and dry.  Psychiatric: She has a normal mood and affect. Her behavior is normal.    Prenatal labs: ABO, Rh: B pos Antibody: Negative Rubella: Immune RPR: Non Reactive  HBsAg: Neg  HIV: Neg GBS:  Neg  Assessment/Plan: IUP at 40w 0d SROM  Labor  Admit  to [facility] per consult with Dr. Richardson Dopp. Pt desires non-interventive birth and plans water birth.   Routine admission orders.      Zuleima Haser O. 02/27/2013, 4:33 AM

## 2013-02-27 NOTE — Progress Notes (Signed)
Clinical Social Work Department PSYCHOSOCIAL ASSESSMENT - MATERNAL/CHILD 02/27/2013  Patient:  Holly Wright,Holly Wright  Account Number:  [id number]  Admit Date:  02/27/2013  Childs Name:   Holly Wright    Clinical Social Worker:  Thedford Bunton, LCSW   Date/Time:  02/27/2013 12:00 M  Date Referred:  02/27/2013   Referral source  Central Nursery     Referred reason  Depression/Anxiety   Other referral source:    I:  FAMILY / HOME ENVIRONMENT Child's legal guardian:  PARENT  Guardian - Name Guardian - Age Guardian - Address  Shaft,Jisela Wright [address]  [place], [place] [address]  Kalman, Chris  same as above   Other household support members/support persons Other support:    II  PSYCHOSOCIAL DATA Information Source:    Financial and Community Resources Employment:   Spouse is employed   Financial resources:  Private Insurance If Medicaid - County:    School / Grade:   Maternity Care Coordinator / Child Services Coordination / Early Interventions:  Cultural issues impacting care:    III  STRENGTHS Strengths  Supportive family/friends  Home prepared for Child (including basic supplies)  Adequate Resources   Strength comment:    IV  RISK FACTORS AND CURRENT PROBLEMS Current Problem:       V  SOCIAL WORK ASSESSMENT Acknowledged order for Social Work consult to assess mother's history of depression.  Parents are married and have no other dependents.  Mother reports hx of depression. Informed that she was diagnosed with depression when she was 33 years old, and eventually started on Zoloft. Informed that she was abandoned by her father and this triggered the depression.  She denies any treatment as an adult.   She denies any current symptoms of depression or anxiety and reports no hx of psychiatric hospitalization. Discussed signs/symptoms of PP depression with mother. Provided her with literature and treatment resources if needed. She denies any illicit drug use  during pregnancy. No acute social concerns reported or noted at this time. Mother informed of social work availability.      VI SOCIAL WORK PLAN Social Work Plan  No Further Intervention Required / No Barriers to Discharge   Type of pt/family education:   If child protective services report - county:   If child protective services report - date:   Information/referral to community resources comment:   Other social work plan:     

## 2013-02-27 NOTE — Lactation Note (Addendum)
This note was copied from the chart of Holly Wright. Lactation Consultation Note Initial consult;  [name] 8013 hours old and sleeping in mother's arms having recently breastfed.  Mother breastfed older brother for 16 months.  Mother a little concerned this baby is not as vigorous a breastfeeder than her brother.  Recognized each baby is different and she is still so new.  Refreshed parents on waking techniques and deep wide latch with the assistance of dad.  Mother stated she understood how to hand express. Reviewed breastfeeding basics, lactation support services and brochure.  Encourarged parents to call for further assistance and questions.   Patient Name: Holly Wright Reason for consult: Initial assessment   Maternal Data    Feeding Feeding Type: Breast Fed Length of feed: 60 min (on and off)  LATCH Score/Interventions Latch: Grasps breast easily, tongue down, lips flanged, rhythmical sucking.  Audible Swallowing: None Intervention(s): Hand expression;Skin to skin  Type of Nipple: Everted at rest and after stimulation  Comfort (Breast/Nipple): Soft / non-tender     Hold (Positioning): No assistance needed to correctly position infant at breast.  LATCH Score: 8  Lactation Tools Discussed/Used     Consult Status Consult Status: Follow-up Date: 02/28/13 Follow-up type: In-patient    Dahlia ByesBerkelhammer, Teyon Odette [facility] Veterans HospitalBoschen Wright, 6:06 PM

## 2013-02-27 NOTE — MAU Note (Signed)
Reports her water broke, trickle then gush at 0250, clear and having contractions.  Pt requests water birth.

## 2013-02-27 NOTE — Progress Notes (Signed)
Pt reported to room and immediately went to restroom to use bathroom.  While sitting on toilet pt felt urge to push.  RN helped pt to bed and RN attempted to monitor FHR for 3 min before infant was born.  RN was unable to find FHR due to pt movement and position.

## 2013-02-28 LAB — CBC
HEMATOCRIT: 36.3 % (ref 36.0–46.0)
Hemoglobin: 12.2 g/dL (ref 12.0–15.0)
MCH: 28.8 pg (ref 26.0–34.0)
MCHC: 33.6 g/dL (ref 30.0–36.0)
MCV: 85.6 fL (ref 78.0–100.0)
PLATELETS: 141 10*3/uL — AB (ref 150–400)
RBC: 4.24 MIL/uL (ref 3.87–5.11)
RDW: 13.5 % (ref 11.5–15.5)
WBC: 12.5 10*3/uL — AB (ref 4.0–10.5)

## 2013-02-28 LAB — RPR: RPR Ser Ql: NONREACTIVE

## 2013-02-28 MED ORDER — IBUPROFEN 600 MG PO TABS: 600.0000 mg | ORAL_TABLET | Freq: Four times a day (QID) | ORAL | Status: DC

## 2013-02-28 MED ORDER — OXYCODONE-ACETAMINOPHEN 5-325 MG PO TABS: 1.0000 | ORAL_TABLET | ORAL | Status: DC | PRN

## 2013-02-28 NOTE — Discharge Summary (Signed)
Obstetric Discharge Summary  Reason for Admission: onset of labor Prenatal Procedures: none Intrapartum Procedures: spontaneous vaginal delivery by Elsie RaNona Smith CNM Postpartum Procedures: none Complications-Operative and Postpartum: none  Hemoglobin  Date Value Range Status  02/28/2013 12.2  12.0 - 15.0 g/dL Final     HCT  Date Value Range Status  02/28/2013 36.3  36.0 - 46.0 % Final    Discharge Diagnoses: Term Pregnancy-delivered  Discharge Information:  Date: 02/28/2013 Activity: unrestricted Diet: routine Medications: Ibuprofen, Colace and Percocet Condition: stable  Breastfeeding: yes  Instructions: refer to practice specific booklet Discharge to: home Contraception: vasectomy / condoms   Newborn Data: Live born  Information for the patient's newborn:  Garnette CzechSAMPSON, Girl Sena HitchCasie [[id number]][[id number]]  female Baby Cadence Home with mother.  Priti Consoli A MD 02/28/2013, 9:42 AM

## 2013-05-07 ENCOUNTER — Ambulatory Visit (INDEPENDENT_AMBULATORY_CARE_PROVIDER_SITE_OTHER): Payer: BC Managed Care – PPO | Admitting: Physician Assistant

## 2013-05-07 VITALS — BP 116/70 | HR 90 | Temp 98.7°F | Resp 18 | Ht 65.0 in | Wt 156.0 lb

## 2013-05-07 DIAGNOSIS — J029 Acute pharyngitis, unspecified: Secondary | ICD-10-CM

## 2013-05-07 DIAGNOSIS — J040 Acute laryngitis: Secondary | ICD-10-CM

## 2013-05-07 DIAGNOSIS — R059 Cough, unspecified: Secondary | ICD-10-CM

## 2013-05-07 DIAGNOSIS — R05 Cough: Secondary | ICD-10-CM

## 2013-05-07 MED ORDER — IPRATROPIUM BROMIDE 0.03 % NA SOLN: 2.0000 | Freq: Two times a day (BID) | NASAL | Status: DC

## 2013-05-07 NOTE — Progress Notes (Signed)
   Subjective:    Patient ID: Holly Wright, female    DOB: 08/16/1980, 33 y.o.   MRN: [id number]  HPI Patient with a 2 week history of non-productive cough with is getting worse.  She reports a 1 week history of sore throat that is mainly at night and laryngitis in the mornings that resolves with drinking.    Additionally, she reports itching to her upper chest for about 2 weeks on and off, no rash, that she thinks may be related to dry skin or hormones s/p pregnancy.  Review of Systems  Constitutional: Negative for fever, chills, appetite change and fatigue.  HENT: Positive for rhinorrhea (clear) and sore throat (around 3 am and the mornings). Negative for congestion, ear discharge, ear pain, postnasal drip and sinus pressure.   Eyes: Negative.   Respiratory: Positive for cough and shortness of breath (in the mornings only). Negative for wheezing.   Musculoskeletal: Negative for myalgias.      Objective:   Physical Exam  Constitutional: She is oriented to person, place, and time. She appears well-developed and well-nourished. No distress.  HENT:  Head: Normocephalic and atraumatic.  Right Ear: Tympanic membrane and external ear normal.  Left Ear: Tympanic membrane and external ear normal.  Nose: Mucosal edema and rhinorrhea present. Right sinus exhibits no maxillary sinus tenderness and no frontal sinus tenderness. Left sinus exhibits no maxillary sinus tenderness and no frontal sinus tenderness.  Mouth/Throat: Oropharynx is clear and moist. No oropharyngeal exudate.  Eyes: Conjunctivae and EOM are normal. Pupils are equal, round, and reactive to light. Right eye exhibits no discharge. Left eye exhibits no discharge. No scleral icterus.  Neck: Normal range of motion. Neck supple. No thyromegaly present.  Cardiovascular: Normal rate, regular rhythm and normal heart sounds.  Exam reveals no gallop and no friction rub.   No murmur heard. Pulmonary/Chest: Effort normal and breath sounds  normal. No respiratory distress. She has no wheezes. She has no rhonchi. She has no rales.  Lymphadenopathy:       Head (right side): No submandibular, no tonsillar, no preauricular, no posterior auricular and no occipital adenopathy present.       Head (left side): No submandibular, no tonsillar, no preauricular, no posterior auricular and no occipital adenopathy present.    She has no cervical adenopathy.  Neurological: She is alert and oriented to person, place, and time.  Skin: Skin is warm, dry and intact.     Psychiatric: She has a normal mood and affect. Her behavior is normal. Judgment and thought content normal.      Assessment & Plan:   1. Cough Likely due to post nasal drip.  Use mucinex DM 12 hour per box directions.  2. Sore throat Likely due to post nasal drip. Symptomatic treatment. - ipratropium (ATROVENT) 0.03 % nasal spray; Place 2 sprays into both nostrils 2 (two) times daily.  Dispense: 30 mL; Refill: 0  3. Laryngitis Likely due to post nasal drip.

## 2013-05-07 NOTE — Patient Instructions (Addendum)
Purchase mucinex DM 12 hour at the drug store and take per box directions to thin secretions and reduce cough.  May gargle with warm salt water to soothe sore throat as needed.  Use moisturizer to dry skin on the chest as needed.   Cough, Adult  A cough is a reflex that helps clear your throat and airways. It can help heal the body or may be a reaction to an irritated airway. A cough may only last 2 or 3 weeks (acute) or may last more than 8 weeks (chronic).  CAUSES Acute cough:  Viral or bacterial infections. Chronic cough:  Infections.  Allergies.  Asthma.  Post-nasal drip.  Smoking.  Heartburn or acid reflux.  Some medicines.  Chronic lung problems (COPD).  Cancer. SYMPTOMS   Cough.  Fever.  Chest pain.  Increased breathing rate.  High-pitched whistling sound when breathing (wheezing).  Colored mucus that you cough up (sputum). TREATMENT   A bacterial cough may be treated with antibiotic medicine.  A viral cough must run its course and will not respond to antibiotics.  Your caregiver may recommend other treatments if you have a chronic cough. HOME CARE INSTRUCTIONS   Only take over-the-counter or prescription medicines for pain, discomfort, or fever as directed by your caregiver. Use cough suppressants only as directed by your caregiver.  Use a cold steam vaporizer or humidifier in your bedroom or home to help loosen secretions.  Sleep in a semi-upright position if your cough is worse at night.  Rest as needed.  Stop smoking if you smoke. SEEK IMMEDIATE MEDICAL CARE IF:   You have pus in your sputum.  Your cough starts to worsen.  You cannot control your cough with suppressants and are losing sleep.  You begin coughing up blood.  You have difficulty breathing.  You develop pain which is getting worse or is uncontrolled with medicine.  You have a fever. MAKE SURE YOU:   Understand these instructions.  Will watch your  condition.  Will get help right away if you are not doing well or get worse. Document Released: 08/09/2010 Document Revised: 05/05/2011 Document Reviewed: 08/09/2010 [facility] Patient Information 2014 [place], [place].

## 2015-10-11 ENCOUNTER — Encounter (HOSPITAL_COMMUNITY): Payer: Self-pay | Admitting: *Deleted

## 2015-10-11 ENCOUNTER — Emergency Department (HOSPITAL_COMMUNITY)
Admission: EM | Admit: 2015-10-11 | Discharge: 2015-10-11 | Disposition: A | Discharge status: Home / Self Care | Payer: Self-pay | Attending: Emergency Medicine | Admitting: Emergency Medicine

## 2015-10-11 DIAGNOSIS — N9089 Other specified noninflammatory disorders of vulva and perineum: Secondary | ICD-10-CM

## 2015-10-11 DIAGNOSIS — Z87891 Personal history of nicotine dependence: Secondary | ICD-10-CM | POA: Insufficient documentation

## 2015-10-11 DIAGNOSIS — L918 Other hypertrophic disorders of the skin: Secondary | ICD-10-CM | POA: Insufficient documentation

## 2015-10-11 NOTE — Discharge Instructions (Signed)
Please read and follow all provided instructions.  Your diagnoses today include:  1. Skin tag of female perineum     Tests performed today include: Vital signs. See below for your results today.   Medications prescribed:  Take as prescribed   Home care instructions:  Follow any educational materials contained in this packet.  Follow-up instructions: Please follow-up with GYN for further evaluation of symptoms and treatment   Return instructions:  Please return to the Emergency Department if you do not get better, if you get worse, or new symptoms OR  - Fever (temperature greater than 101.9F)  - Bleeding that does not stop with holding pressure to the area    -Severe pain (please note that you may be more sore the day after your accident)  - Chest Pain  - Difficulty breathing  - Severe nausea or vomiting  - Inability to tolerate food and liquids  - Passing out  - Skin becoming red around your wounds  - Change in mental status (confusion or lethargy)  - New numbness or weakness    Please return if you have any other emergent concerns.  Additional Information:  Your vital signs today were: BP 131/97 (BP Location: Right Arm)    Pulse 105    Temp 98.4 F (36.9 C) (Oral)    Resp 20    Ht 5\' 5"  (1.651 m)    Wt 62.1 kg    SpO2 99%    BMI 22.80 kg/m  If your blood pressure (BP) was elevated above 135/85 this visit, please have this repeated by your doctor within one month. ---------------

## 2015-10-11 NOTE — ED Provider Notes (Signed)
[facility]-EMERGENCY DEPT Provider Note   CSN: [id number] Arrival date & time: 10/11/15  1409  By signing my name below, I, Freida Busmaniana Omoyeni, attest that this documentation has been prepared under the direction and in the presence of non-physician practitioner, Audry Piliyler Mahki Spikes, PA-C. Electronically Signed: Freida Busmaniana Omoyeni, Scribe. 10/11/2015. 2:48 PM.   History   Chief Complaint Chief Complaint  Patient presents with  . Abscess    The history is provided by the patient. No language interpreter was used.    HPI Comments:  Holly Wright is a 35 y.o. female who presents to the Emergency Department complaining of a black growth at the bottom of her vagina which she first noticed this afternoon. She states there is blood draining from the site; denies picking at the site. She denies vagina pain, abdominal pain,  and fever.   Past Medical History:  Diagnosis Date  . Depression   . Pyelonephritis   . Tension headache   . Umbilical hernia 2013    Patient Active Problem List   Diagnosis Date Noted  . Active labor at term 02/27/2013  . Vaginal delivery 02/27/2013  . Umbilical hernia 04/02/2012  . H/O: depression 04/02/2012  . H/O pyelonephritis 04/02/2012    Past Surgical History:  Procedure Laterality Date  . EYE SURGERY    . LASIK      OB History    Gravida Para Term Preterm AB Living   2 2 2     2    SAB TAB Ectopic Multiple Live Births           2       Home Medications    Prior to Admission medications   Medication Sig Start Date End Date Taking? Authorizing Provider  calcium carbonate (TUMS - DOSED IN MG ELEMENTAL CALCIUM) 500 MG chewable tablet Chew 1-2 tablets by mouth 3 (three) times daily as needed for indigestion or heartburn.    Historical Provider, MD  ibuprofen (ADVIL,MOTRIN) 600 MG tablet Take 1 tablet (600 mg total) by mouth every 6 (six) hours. 02/28/13   Silverio LaySandra Rivard, MD  ipratropium (ATROVENT) 0.03 % nasal spray Place 2 sprays into both nostrils 2 (two) times  daily. 05/07/13   Porfirio Oarhelle Jeffery, PA-C  Misc Natural Products (PLACENTA PO) Take by mouth.    Historical Provider, MD  Omega-3 Fatty Acids (FISH OIL PO) Take 1 tablet by mouth daily.    Historical Provider, MD  oxyCODONE-acetaminophen (PERCOCET/ROXICET) 5-325 MG per tablet Take 1-2 tablets by mouth every 4 (four) hours as needed for severe pain (moderate - severe pain). 02/28/13   Silverio LaySandra Rivard, MD  Prenatal Vit-Fe Fumarate-FA (PRENATAL MULTIVITAMIN) TABS Take 1 tablet by mouth daily.    Historical Provider, MD    Family History Family History  Problem Relation Age of Onset  . Hypertension Mother   . Heart disease Maternal Grandfather   . Cancer Maternal Grandfather   . Cancer Paternal Grandfather   . Cancer Father     liver   . Cancer Maternal Grandmother     liver and breast    Social History Social History  Substance Use Topics  . Smoking status: Former Smoker    Quit date: 09/11/2010  . Smokeless tobacco: Never Used  . Alcohol use Yes     Comment: Occas social use     Allergies   Review of patient's allergies indicates no known allergies.   Review of Systems Review of Systems  Constitutional: Negative for fever.  Genitourinary: Negative for vaginal discharge  and vaginal pain.  Skin: Positive for wound.     Physical Exam Updated Vital Signs BP 131/97 (BP Location: Right Arm)   Pulse 105   Temp 98.4 F (36.9 C) (Oral)   Resp 20   Ht 5\' 5"  (1.651 m)   Wt 137 lb (62.1 kg)   SpO2 99%   BMI 22.80 kg/m   Physical Exam  Constitutional: She is oriented to person, place, and time. Vital signs are normal. She appears well-developed and well-nourished. No distress.  HENT:  Head: Normocephalic and atraumatic.  Right Ear: Hearing normal.  Left Ear: Hearing normal.  Eyes: Conjunctivae and EOM are normal. Pupils are equal, round, and reactive to light.  Neck: Normal range of motion. Neck supple.  Cardiovascular: Normal rate and regular rhythm.   Pulmonary/Chest:  Effort normal.  Abdominal: She exhibits no distension.  Genitourinary:  Genitourinary Comments: Chaperone Present. Small 1 cm skin tag noted below labia. Non TTP. No signs of infection. No purulence   Neurological: She is alert and oriented to person, place, and time.  Skin: Skin is warm and dry.  Psychiatric: She has a normal mood and affect. Her speech is normal and behavior is normal. Thought content normal.  Nursing note and vitals reviewed.  ED Treatments / Results  DIAGNOSTIC STUDIES:  Oxygen Saturation is 99% on RA, normal by my interpretation.    COORDINATION OF CARE:  2:47 PM Discussed treatment plan with pt at bedside and pt agreed to plan.  Labs (all labs ordered are listed, but only abnormal results are displayed) Labs Reviewed - No data to display  EKG  EKG Interpretation None       Radiology No results found.  Procedures Procedures (including critical care time)  Medications Ordered in ED Medications - No data to display   Initial Impression / Assessment and Plan / ED Course  I have reviewed the triage vital signs and the nursing notes.  Pertinent labs & imaging results that were available during my care of the patient were reviewed by me and considered in my medical decision making (see chart for details).  Clinical Course     Final Clinical Impressions(s) / ED Diagnoses  I have reviewed the relevant previous healthcare records. I obtained HPI from historian. Patient discussed with supervising physician  ED Course:  Assessment: Pt in NAD. Afebrile. Asymptomatic. Likely skin tag below introitus. Non TTP. No signs of infection. Plan is to DC home with follow up to GYN for further eval.    Disposition/Plan:  DC home Additional Verbal discharge instructions given and discussed with patient.  Pt Instructed to f/u with GYn in the next week for evaluation and treatment of symptoms. Return precautions given Pt acknowledges and agrees with  plan  Supervising Physician Lyndal Pulleyaniel Knott, MD   Final diagnoses:  Skin tag of female perineum    New Prescriptions New Prescriptions   No medications on file   I personally performed the services described in this documentation, which was scribed in my presence. The recorded information has been reviewed and is accurate.    Audry Piliyler Calyx Hawker, PA-C 10/11/15 1457    Lyndal Pulleyaniel Knott, MD 10/12/15 [phone]

## 2015-10-11 NOTE — ED Triage Notes (Signed)
To ED for eval of possible abscess to outer vaginal area. States its the size of a small grape and bleeding. She just noticed today. No pain

## 2015-10-11 NOTE — ED Notes (Signed)
Declined W/C at D/C and was escorted to lobby by RN. 

## 2018-08-21 NOTE — Progress Notes (Signed)
Tawana ScaleZach Wright D.O. [facility] [address] [place], [place] [address] Phone: [phone] Subjective:   Bruce Donath, Valerie Wolf, am serving as a scribe for Dr. Antoine PrimasZachary Wright.  I'm seeing this patient by the request  of:    CC: Right knee pain  YQM:[id number]:Subjective  Jashae L Niemann is a 38 y.o. female coming in with complaint of right knee pain. In February went for a run and then the next day had some swelling. Saw PA at [facility]. Was diagnosed as having arthritis. Had cortisone shot. Knee was better for a period of time. Did have swelling again following activity. Had another injection on May 11th. Had one week of relief. Pain is located over the patella. Does wear knee brace for compression. Using crutches now for one week. Walking started to increase her pain. Was using NSAIDs prn.     Patient did have an MRI of the knee done previously.  This was done on June 9.  Independently visualized by me.  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  Moderate effusion noted with a partial vastus lateralis tear also noted.  Patient was found to have a fluid hematoma in the vastus lateralis muscle   Past Medical History:  Diagnosis Date  . Depression   . Pyelonephritis   . Tension headache   . Umbilical hernia 2013   Past Surgical History:  Procedure Laterality Date  . EYE SURGERY    . LASIK     Social History   Socioeconomic History  . Marital status: Married    Spouse name: Not on file  . Number of children: Not on file  . Years of education: Not on file  . Highest education level: Not on file  Occupational History  . Not on file  Social Needs  . Financial resource strain: Not on file  . Food insecurity    Worry: Not on file    Inability: Not on file  . Transportation needs    Medical: Not on file    Non-medical: Not on file  Tobacco Use  . Smoking status: Former Smoker    Quit date: 09/11/2010   Years since quitting: 7.9  . Smokeless tobacco: Never Used  Substance and Sexual Activity  . Alcohol use: Yes    Comment: Occas social use  . Drug use: No    Types: Marijuana    Comment: Last use 4/12 and occas at that time  . Sexual activity: Yes    Partners: Male    Birth control/protection: Pill    Comment: orthomicronor   Lifestyle  . Physical activity    Days per week: Not on file    Minutes per session: Not on file  . Stress: Not on file  Relationships  . Social Musicianconnections    Talks on phone: Not on file    Gets together: Not on file    Attends religious service: Not on file    Active member of club or organization: Not on file    Attends meetings of clubs or organizations: Not on file    Relationship status: Not on file  Other Topics Concern  . Not on file  Social History Narrative  . Not on file   No Known Allergies Family History  Problem Relation Age of Onset  . Hypertension Mother   . Heart disease Maternal Grandfather   . Cancer Maternal Grandfather   . Cancer Paternal Grandfather   .  Cancer Father        liver   . Cancer Maternal Grandmother        liver and breast      Current Outpatient Medications (Respiratory):  .  ipratropium (ATROVENT) 0.03 % nasal spray, Place 2 sprays into both nostrils 2 (two) times daily.  Current Outpatient Medications (Analgesics):  .  ibuprofen (ADVIL,MOTRIN) 600 MG tablet, Take 1 tablet (600 mg total) by mouth every 6 (six) hours. Marland Kitchen.  oxyCODONE-acetaminophen (PERCOCET/ROXICET) 5-325 MG per tablet, Take 1-2 tablets by mouth every 4 (four) hours as needed for severe pain (moderate - severe pain).   Current Outpatient Medications (Other):  .  calcium carbonate (TUMS - DOSED IN MG ELEMENTAL CALCIUM) 500 MG chewable tablet, Chew 1-2 tablets by mouth 3 (three) times daily as needed for indigestion or heartburn. .  Misc Natural Products (PLACENTA PO), Take by mouth. .  Omega-3 Fatty Acids (FISH OIL PO), Take 1 tablet by mouth  daily. .  Prenatal Vit-Fe Fumarate-FA (PRENATAL MULTIVITAMIN) TABS, Take 1 tablet by mouth daily. .  Vitamin D, Ergocalciferol, (DRISDOL) 1.25 MG (50000 UT) CAPS capsule, Take 1 capsule (50,000 Units total) by mouth every 7 (seven) days.    Past medical history, social, surgical and family history all reviewed in electronic medical record.  No pertanent information unless stated regarding to the chief complaint.   Review of Systems:  No headache, visual changes, nausea, vomiting, diarrhea, constipation, dizziness, abdominal pain, skin rash, fevers, chills, night sweats, weight loss, swollen lymph nodes,muscle aches, chest pain, shortness of breath, mood changes.  Positive muscle aches, body aches, joint swelling  Objective  Blood pressure 110/80, pulse 95, height 5\' 5"  (1.651 m), SpO2 98 %, currently breastfeeding.    General: No apparent distress alert and oriented x3 mood and affect normal, dressed appropriately.  HEENT: Pupils equal, extraocular movements intact  Respiratory: Patient's speak in full sentences and does not appear short of breath  Cardiovascular: No lower extremity edema, non tender, no erythema  Skin: Warm dry intact with no signs of infection or rash on extremities or on axial skeleton.  Abdomen: Soft nontender  Neuro: Cranial nerves II through XII are intact, neurovascularly intact in all extremities with 2+ DTRs and 2+ pulses.  Lymph: No lymphadenopathy of posterior or anterior cervical chain or axillae bilaterally.  Gait using crutches. MSK:  Non tender with full range of motion and good stability and symmetric strength and tone of shoulders, elbows, wrist, hip, and ankles bilaterally.  Right knee exam shows severe effusion noted.  Unable to bend greater than 90 degrees secondary to pressure.  No significant tenderness over the medial or lateral joint line but severe tenderness over the patella. Contralateral knee unremarkable  Procedure: Real-time Ultrasound Guided  Injection of right knee Device: GE Logiq Q7 Ultrasound guided injection is preferred based studies that show increased duration, increased effect, greater accuracy, decreased procedural pain, increased response rate, and decreased cost with ultrasound guided versus blind injection.  Verbal informed consent obtained.  Time-out conducted.  Noted no overlying erythema, induration, or other signs of local infection.  Skin prepped in a sterile fashion.  Local anesthesia: Topical Ethyl chloride.  With sterile technique and under real time ultrasound guidance: With a 22-gauge 2 inch needle patient was injected with 4 cc of 0.5% Marcaine and aspirated 100 cc of straw-colored fluid then injected Monovisc 48 mg/mL  This was from a superior lateral approach.  Completed without difficulty  Pain immediately resolved suggesting accurate placement of  the medication.  Advised to call if fevers/chills, erythema, induration, drainage, or persistent bleeding.  Images permanently stored and available for review in the ultrasound unit.  Impression: Technically successful ultrasound guided injection.  [id number]; 15 additional minutes spent for Therapeutic exercises as stated in above notes.  This included exercises focusing on stretching, strengthening, with significant focus on eccentric aspects.   Long term goals include an improvement in range of motion, strength, endurance as well as avoiding reinjury. Patient's frequency would include in 1-2 times a day, 3-5 times a week for a duration of 6-12 weeks. Patellofemoral Syndrome Rehab  Isometric contractions of thigh - 10 x 10 secs  3 way straight leg raises - build to 3 sets of 30 and then add weights begin with no weight. When 3 x 30 reached, Add 2 lb. ankle weight. Increase to 3,4,5,6 when 3x30 achieved.  Drop squats - limit to 45 deg, 3x15  Modified lunge - running position, 3x15  Seated quad extensions, 3x15, add ankle weights  Step downs, 3x15 with body  weight slowly on downward phase  Knee up and open hip: knee up and externally rotate hip to open position, hold 2 sec and repeat each leg, 30 reps  Cone Drills: Right Leg, Right Hand Right Leg, Left Hand Left Leg, Right Hand Left Leg, Left Hand Start with 1 cone, progress to 3 20 each exercise  Lateral Leg Reach Balance knee, reach out laterally to cone and touch. Hold at Knee up position for 2 seconds 20 reps each leg  Rear Leg Reach Directly behind - place cone 20 reps \  Proper technique shown and discussed handout in great detail with ATC.  All questions were discussed and answered.     Impression and Recommendations:     This case required medical decision making of moderate complexity. The above documentation has been reviewed and is accurate and complete Lyndal Pulley, DO       Note: This dictation was prepared with Dragon dictation along with smaller phrase technology. Any transcriptional errors that result from this process are unintentional.

## 2018-08-23 ENCOUNTER — Encounter: Payer: Self-pay | Admitting: Family Medicine

## 2018-08-23 ENCOUNTER — Other Ambulatory Visit: Payer: Self-pay

## 2018-08-23 ENCOUNTER — Ambulatory Visit: Payer: Self-pay

## 2018-08-23 ENCOUNTER — Ambulatory Visit (INDEPENDENT_AMBULATORY_CARE_PROVIDER_SITE_OTHER): Payer: Self-pay | Admitting: Family Medicine

## 2018-08-23 VITALS — BP 110/80 | HR 95 | Ht 65.0 in

## 2018-08-23 DIAGNOSIS — G8929 Other chronic pain: Secondary | ICD-10-CM

## 2018-08-23 DIAGNOSIS — M25561 Pain in right knee: Secondary | ICD-10-CM

## 2018-08-23 DIAGNOSIS — M1711 Unilateral primary osteoarthritis, right knee: Secondary | ICD-10-CM

## 2018-08-23 MED ORDER — VITAMIN D (ERGOCALCIFEROL) 1.25 MG (50000 UNIT) PO CAPS: 50000.0000 [IU] | ORAL_CAPSULE | ORAL | 0 refills | Status: AC

## 2018-08-23 NOTE — Assessment & Plan Note (Signed)
Patient given viscosupplementation. Discussed with patient in great length that this can take about a month.  He has discussed with the recurrent swelling I would like patients injection.  Patient will continue with bracing.  He may need surgical intervention for the grade III chondromalacia continues.  Laboratory work-up of the aspiration to rule out gout or possible infectious etiology.  Follow-up again in 4 to 6 weeks

## 2018-08-23 NOTE — Patient Instructions (Addendum)
Good to see you.  Ice 20 minutes 2 times daily. Usually after activity and before bed. Exercises 3 times a week.  voltaren gel 2 times a day over the counter Wear compression daily   Monovisc given will take about a month to notice full effect  Once weekly vitamin D for 12 weeks.  See me again in 4 weeks

## 2018-08-24 HISTORY — PX: BREAST ENHANCEMENT SURGERY: SHX7

## 2018-08-24 LAB — SYNOVIAL CELL COUNT + DIFF, W/ CRYSTALS
Basophils, %: 0 %
Eosinophils-Synovial: 1 % (ref 0–2)
Lymphocytes-Synovial Fld: 17 % (ref 0–74)
Monocyte/Macrophage: 9 % (ref 0–69)
Neutrophil, Synovial: 73 % — ABNORMAL HIGH (ref 0–24)
Synoviocytes, %: 0 % (ref 0–15)
WBC, Synovial: 5370 cells/uL — ABNORMAL HIGH (ref <150)

## 2018-09-19 NOTE — Progress Notes (Signed)
[facility] [address] [place], [place] [address] Phone: [phone] Subjective:    I'm seeing this patient by the request  of:    CC: Knee pain follow-up  FIE:[id number]  Holly Wright is a 38 y.o. female coming in with complaint of right knee pain.  Patient is doing better overall.  Patient states 60 to 70% better.  Is going to start bike ride here soon.  Physical therapy has been very beneficial.  States that the swelling is much better and the deep pain is much better      Injection of Visco supplementation was given on September 03, 2018  Past Medical History:  Diagnosis Date  . Depression   . Pyelonephritis   . Tension headache   . Umbilical hernia 6606   Past Surgical History:  Procedure Laterality Date  . EYE SURGERY    . LASIK     Social History   Socioeconomic History  . Marital status: Married    Spouse name: Not on file  . Number of children: Not on file  . Years of education: Not on file  . Highest education level: Not on file  Occupational History  . Not on file  Social Needs  . Financial resource strain: Not on file  . Food insecurity    Worry: Not on file    Inability: Not on file  . Transportation needs    Medical: Not on file    Non-medical: Not on file  Tobacco Use  . Smoking status: Former Smoker    Quit date: 09/11/2010    Years since quitting: 8.0  . Smokeless tobacco: Never Used  Substance and Sexual Activity  . Alcohol use: Yes    Comment: Occas social use  . Drug use: No    Types: Marijuana    Comment: Last use 4/12 and occas at that time  . Sexual activity: Yes    Partners: Male    Birth control/protection: Pill    Comment: orthomicronor   Lifestyle  . Physical activity    Days per week: Not on file    Minutes per session: Not on file  . Stress: Not on file  Relationships  . Social Herbalist on phone: Not on file    Gets together: Not on file    Attends religious service: Not on  file    Active member of club or organization: Not on file    Attends meetings of clubs or organizations: Not on file    Relationship status: Not on file  Other Topics Concern  . Not on file  Social History Narrative  . Not on file   No Known Allergies Family History  Problem Relation Age of Onset  . Hypertension Mother   . Heart disease Maternal Grandfather   . Cancer Maternal Grandfather   . Cancer Paternal Grandfather   . Cancer Father        liver   . Cancer Maternal Grandmother        liver and breast         Current Outpatient Medications (Other):  .  calcium carbonate (TUMS - DOSED IN MG ELEMENTAL CALCIUM) 500 MG chewable tablet, Chew 1-2 tablets by mouth 3 (three) times daily as needed for indigestion or heartburn. .  Omega-3 Fatty Acids (FISH OIL PO), Take 1 tablet by mouth daily. .  Vitamin D, Ergocalciferol, (DRISDOL) 1.25 MG (50000 UT) CAPS capsule, Take 1 capsule (50,000 Units  total) by mouth every 7 (seven) days.    Past medical history, social, surgical and family history all reviewed in electronic medical record.  No pertanent information unless stated regarding to the chief complaint.   Review of Systems:  No headache, visual changes, nausea, vomiting, diarrhea, constipation, dizziness, abdominal pain, skin rash, fevers, chills, night sweats, weight loss, swollen lymph nodes, body aches, joint swelling, muscle aches, chest pain, shortness of breath, mood changes.   Objective  Blood pressure 108/72, pulse 98, resp. rate 16, height 5\' 5"  (1.651 m), weight 136 lb (61.7 kg), SpO2 98 %, currently breastfeeding.    General: No apparent distress alert and oriented x3 mood and affect normal, dressed appropriately.  HEENT: Pupils equal, extraocular movements intact  Respiratory: Patient's speak in full sentences and does not appear short of breath  Cardiovascular: No lower extremity edema, non tender, no erythema  Skin: Warm dry intact with no signs of infection  or rash on extremities or on axial skeleton.  Abdomen: Soft nontender  Neuro: Cranial nerves II through XII are intact, neurovascularly intact in all extremities with 2+ DTRs and 2+ pulses.  Lymph: No lymphadenopathy of posterior or anterior cervical chain or axillae bilaterally.  Gait normal with good balance and coordination.  MSK:  Non tender with full range of motion and good stability and symmetric strength and tone of shoulders, elbows, wrist, hip and ankles bilaterally.  Knee: Right valgus deformity noted.  Abnormal thigh to calf ratio.  Tender to palpation over medial and PF joint line.  ROM full in flexion and extension and lower leg rotation. instability with valgus force.  painful patellar compression. Patellar glide with moderate crepitus. Patellar and quadriceps tendons unremarkable. Hamstring and quadriceps strength is normal. Contralateral knee shows no significant abnormality   Impression and Recommendations:     The above documentation has been reviewed and is accurate and complete Holly SaaZachary M Holly Marteney, DO       Note: This dictation was prepared with Dragon dictation along with smaller phrase technology. Any transcriptional errors that result from this process are unintentional.

## 2018-09-20 ENCOUNTER — Ambulatory Visit: Payer: Self-pay | Admitting: Family Medicine

## 2018-09-20 ENCOUNTER — Other Ambulatory Visit: Payer: Self-pay

## 2018-09-20 ENCOUNTER — Encounter: Payer: Self-pay | Admitting: Family Medicine

## 2018-09-20 DIAGNOSIS — M1711 Unilateral primary osteoarthritis, right knee: Secondary | ICD-10-CM

## 2018-09-20 NOTE — Assessment & Plan Note (Signed)
Patient is doing much better after the viscosupplementation.  I think patient will do well with conservative therapy.  We discussed icing regimen and home exercises, which activities of doing which wants to avoid.  Increase activity slowly over the course the next several weeks.  Follow-up again in 5 to 8 weeks

## 2018-09-20 NOTE — Patient Instructions (Signed)
Good to see you  Ice is your friend Compression always with working out.  Rigid sole shoes may be better See me again in 8 weeks if you need me

## 2018-12-10 ENCOUNTER — Encounter: Payer: Self-pay | Admitting: Certified Nurse Midwife

## 2018-12-10 ENCOUNTER — Other Ambulatory Visit: Payer: Self-pay

## 2018-12-15 ENCOUNTER — Encounter: Payer: Self-pay | Admitting: *Deleted

## 2018-12-17 ENCOUNTER — Other Ambulatory Visit: Payer: Self-pay

## 2018-12-17 ENCOUNTER — Encounter: Payer: Self-pay | Admitting: Certified Nurse Midwife

## 2018-12-17 ENCOUNTER — Ambulatory Visit (INDEPENDENT_AMBULATORY_CARE_PROVIDER_SITE_OTHER): Payer: Self-pay | Admitting: Certified Nurse Midwife

## 2018-12-17 VITALS — BP 122/87 | HR 91 | Wt 145.0 lb

## 2018-12-17 DIAGNOSIS — O09521 Supervision of elderly multigravida, first trimester: Secondary | ICD-10-CM

## 2018-12-17 DIAGNOSIS — Z3480 Encounter for supervision of other normal pregnancy, unspecified trimester: Secondary | ICD-10-CM | POA: Insufficient documentation

## 2018-12-17 DIAGNOSIS — Z8659 Personal history of other mental and behavioral disorders: Secondary | ICD-10-CM

## 2018-12-17 DIAGNOSIS — Z3A11 11 weeks gestation of pregnancy: Secondary | ICD-10-CM

## 2018-12-17 DIAGNOSIS — Z113 Encounter for screening for infections with a predominantly sexual mode of transmission: Secondary | ICD-10-CM

## 2018-12-17 DIAGNOSIS — O09529 Supervision of elderly multigravida, unspecified trimester: Secondary | ICD-10-CM | POA: Insufficient documentation

## 2018-12-17 NOTE — Progress Notes (Signed)
Subjective:   Holly Wright is a 38 y.o. G4P3003 at [redacted]w[redacted]d by LMP being seen today for her first obstetrical visit.  Her obstetrical history is significant for advanced maternal age. Patient does intend to breast feed. Pregnancy history fully reviewed. Her last baby was born at home with an unlicensed midwife. She is planning another assisted home birth.  Patient reports fatigue and nausea.  HISTORY: OB History  Gravida Para Term Preterm AB Living  4 3 3  0 0 3  SAB TAB Ectopic Multiple Live Births  0 0 0 0 3    # Outcome Date GA Lbr Len/2nd Weight Sex Delivery Anes PTL Lv  4 Current           3 Term 02/27/13 [redacted]w[redacted]d 02:05 / 00:05 6 lb 14.9 oz (3.144 kg) F Vag-Spont None  LIV     Name: Blinn,GIRL Peityn     Apgar1: 9  Apgar5: 9  2 Term 05/18/11 [redacted]w[redacted]d 03:00 / 00:06 7 lb 3 oz (3.26 kg) M Vag-Spont None  LIV     Name: Corti,BOY Ilithyia     Apgar1: 8  Apgar5: 9  1 Term    8 lb 1 oz (3.657 kg)  Vag-Spont   LIV     Birth Comments: Homebirth   Past Medical History:  Diagnosis Date  . Depression   . Pyelonephritis   . Tension headache   . Umbilical hernia 2013   Past Surgical History:  Procedure Laterality Date  . BREAST ENHANCEMENT SURGERY  08/24/2018  . EYE SURGERY    . LASIK     Family History  Problem Relation Age of Onset  . Hypertension Mother   . Heart disease Maternal Grandfather   . Cancer Maternal Grandfather   . Cancer Paternal Grandfather   . Cancer Father        Esophageal cancer  . Cancer Maternal Grandmother        liver and breast   Social History   Tobacco Use  . Smoking status: Former Smoker    Quit date: 09/11/2010    Years since quitting: 8.2  . Smokeless tobacco: Never Used  Substance Use Topics  . Alcohol use: Not Currently    Comment: Occas social use  . Drug use: Not Currently    Types: Marijuana    Comment: Last use 4/12 and occas at that time   No Known Allergies Current Outpatient Medications on File Prior to Visit  Medication  Sig Dispense Refill  . Prenatal Vit-Fe Fumarate-FA (MULTIVITAMIN-PRENATAL) 27-0.8 MG TABS tablet Take 1 tablet by mouth daily at 12 noon.    . calcium carbonate (TUMS - DOSED IN MG ELEMENTAL CALCIUM) 500 MG chewable tablet Chew 1-2 tablets by mouth 3 (three) times daily as needed for indigestion or heartburn.    . Omega-3 Fatty Acids (FISH OIL PO) Take 1 tablet by mouth daily.    . Vitamin D, Ergocalciferol, (DRISDOL) 1.25 MG (50000 UT) CAPS capsule Take 1 capsule (50,000 Units total) by mouth every 7 (seven) days. (Patient not taking: Reported on 12/17/2018) 12 capsule 0   No current facility-administered medications on file prior to visit.    Exam   Vitals:   12/17/18 0943  BP: 122/87  Pulse: 91  Weight: 145 lb (65.8 kg)   Fetal Heart Rate (bpm): 163  System: General: well-developed, well-nourished female in no acute distress   Breast:  normal appearance, no masses or tenderness   Skin: normal coloration and turgor, no rashes   Neurologic: oriented, normal, negative, normal mood   Extremities: normal strength, tone, and muscle mass, ROM of all joints is normal   HEENT PERRLA, extraocular movement intact and sclera clear, anicteric   Mouth/Teeth mucous membranes moist, pharynx normal without lesions and dental hygiene good   Neck supple and no masses   Cardiovascular: regular rate and rhythm   Respiratory:  no respiratory distress, normal breath sounds   Abdomen: soft, non-tender; bowel sounds normal; no masses,  no organomegaly  Pelvic: deferred   Assessment:   Pregnancy: [id number] Patient Active Problem List   Diagnosis Date Noted  . Maternal age 50+, multigravida 12/17/2018  . Supervision of other normal pregnancy, antepartum 12/17/2018  . Patellofemoral arthritis of right knee 08/23/2018  . Active labor at term 02/27/2013  . Vaginal delivery 02/27/2013  . Umbilical hernia 37/11/6267  . H/O: depression 04/02/2012  . H/O pyelonephritis  04/02/2012     Plan:  1. Supervision of other normal pregnancy, antepartum - Culture, OB Urine - Babyscripts Schedule Optimization - Urine cytology ancillary only([facility]) - pt is planning a home birth: informed her I do not recommend this but we will provide the antenatal care she needs  2. Advanced maternal age - <40, no AT recommend  3. Hx of depression - age 38, her father left - no hx PPD - no current issues  Pt reports hx of PPH after first two deliveries, review of chart shows normal EBL Initial labs will be drawn at [facility] with NIPS next week Continue prenatal vitamins. Genetic Screening discussed, NIPS: requested. Ultrasound discussed; fetal anatomic survey: requested. Problem list reviewed and updated The nature of [place] for [facility] with multiple MDs and other Advanced Practice Providers was explained to patient; also emphasized that fellows, residents, and students are part of our team. Routine obstetric precautions reviewed Return in about 1 week (around 12/24/2018).   Julianne Handler, CNM 2:31 PM 12/17/18

## 2018-12-17 NOTE — Progress Notes (Signed)
Last pap- 2018- negative 

## 2018-12-17 NOTE — Progress Notes (Signed)
Bedside U/S shows single IUP with FHT of 163 BPM and CRL measures 49.67mm GA is [redacted]w[redacted]d.  Pt wishes to have Nipts and PNL done next week

## 2018-12-19 LAB — URINE CULTURE, OB REFLEX: Organism ID, Bacteria: NO GROWTH

## 2018-12-19 LAB — CULTURE, OB URINE

## 2018-12-21 LAB — URINE CYTOLOGY ANCILLARY ONLY
Chlamydia: NEGATIVE
Comment: NEGATIVE
Comment: NORMAL
Neisseria Gonorrhea: NEGATIVE

## 2018-12-23 ENCOUNTER — Telehealth: Payer: Self-pay | Admitting: *Deleted

## 2018-12-23 NOTE — Telephone Encounter (Signed)
Left patient a message that appointment has been moved to [facility] due to power outage at [facility].

## 2018-12-24 ENCOUNTER — Other Ambulatory Visit: Payer: Self-pay

## 2018-12-24 ENCOUNTER — Ambulatory Visit: Payer: Self-pay

## 2018-12-24 DIAGNOSIS — Z3480 Encounter for supervision of other normal pregnancy, unspecified trimester: Secondary | ICD-10-CM

## 2018-12-24 NOTE — Progress Notes (Signed)
Pt here for lab work and genetic testing. Pt sent to lab.

## 2018-12-25 LAB — OBSTETRIC PANEL, INCLUDING HIV
Antibody Screen: NEGATIVE
Basophils Absolute: 0 10*3/uL (ref 0.0–0.2)
Basos: 0 %
EOS (ABSOLUTE): 0.1 10*3/uL (ref 0.0–0.4)
Eos: 1 %
HIV Screen 4th Generation wRfx: NONREACTIVE
Hematocrit: 39.3 % (ref 34.0–46.6)
Hemoglobin: 13.6 g/dL (ref 11.1–15.9)
Hepatitis B Surface Ag: NEGATIVE
Immature Grans (Abs): 0 10*3/uL (ref 0.0–0.1)
Immature Granulocytes: 0 %
Lymphocytes Absolute: 1.4 10*3/uL (ref 0.7–3.1)
Lymphs: 16 %
MCH: 31.3 pg (ref 26.6–33.0)
MCHC: 34.6 g/dL (ref 31.5–35.7)
MCV: 90 fL (ref 79–97)
Monocytes Absolute: 0.5 10*3/uL (ref 0.1–0.9)
Monocytes: 5 %
Neutrophils Absolute: 6.8 10*3/uL (ref 1.4–7.0)
Neutrophils: 78 %
Platelets: 232 10*3/uL (ref 150–450)
RBC: 4.35 x10E6/uL (ref 3.77–5.28)
RDW: 12 % (ref 11.7–15.4)
RPR Ser Ql: NONREACTIVE
Rh Factor: POSITIVE
Rubella Antibodies, IGG: <0.9 index — ABNORMAL LOW (ref >0.99)
WBC: 8.8 10*3/uL (ref 3.4–10.8)

## 2019-01-07 ENCOUNTER — Telehealth: Payer: Self-pay

## 2019-01-07 NOTE — Telephone Encounter (Signed)
Left message for patient. Calling to give her panorama results. Kathrene Alu RN

## 2019-01-14 ENCOUNTER — Encounter: Payer: Self-pay | Admitting: Certified Nurse Midwife

## 2019-02-10 ENCOUNTER — Encounter: Payer: Self-pay | Admitting: Obstetrics and Gynecology

## 2019-02-20 NOTE — Progress Notes (Signed)
Holly Wright [address] [place], [place] [address] Phone: [phone] Subjective:   .scribe This visit occurred during the SARS-CoV-2 public health emergency.  Safety protocols were in place, including screening questions prior to the visit, additional usage of staff PPE, and extensive cleaning of exam room while observing appropriate contact time as indicated for disinfecting solutions.     CC: Knee pain follow-up  [id number]   09/20/2018 Patient is doing much better after the viscosupplementation.  I think patient will do well with conservative therapy.  We discussed icing regimen and home exercises, which activities of doing which wants to avoid.  Increase activity slowly over the course the next several weeks.  Follow-up again in 5 to 8 weeks  Update 02/21/2019 Holly Wright is a 38 y.o. female coming in with complaint of right knee pain. Patient states that she is having discomfort in her knee since last visit. Pain over the patellar tendon. Pain present with activity. Patient is pregnant and notes swelling in the joint. Is wearing compression sleeve today for support. Felt that visco injection in July did help to alleviate her pain for that past 6 months.      Viscosupplementation was given June 2020.  Past Medical History:  Diagnosis Date  . Depression   . Pyelonephritis   . Tension headache   . Umbilical hernia 2013   Past Surgical History:  Procedure Laterality Date  . BREAST ENHANCEMENT SURGERY  08/24/2018  . EYE SURGERY    . LASIK     Social History   Socioeconomic History  . Marital status: Married    Spouse name: Not on file  . Number of children: Not on file  . Years of education: Not on file  . Highest education level: Not on file  Occupational History  . Not on file  Tobacco Use  . Smoking status: Former Smoker    Quit date: 09/11/2010    Years since quitting: 8.4  . Smokeless tobacco: Never Used  Substance and Sexual  Activity  . Alcohol use: Not Currently    Comment: Occas social use  . Drug use: Not Currently    Types: Marijuana    Comment: Last use 4/12 and occas at that time  . Sexual activity: Yes    Partners: Male  Other Topics Concern  . Not on file  Social History Narrative  . Not on file   Social Determinants of Health   Financial Resource Strain:   . Difficulty of Paying Living Expenses: Not on file  Food Insecurity:   . Worried About Programme researcher, broadcasting/film/video in the Last Year: Not on file  . Ran Out of Food in the Last Year: Not on file  Transportation Needs:   . Lack of Transportation (Medical): Not on file  . Lack of Transportation (Non-Medical): Not on file  Physical Activity:   . Days of Exercise per Week: Not on file  . Minutes of Exercise per Session: Not on file  Stress:   . Feeling of Stress : Not on file  Social Connections:   . Frequency of Communication with Friends and Family: Not on file  . Frequency of Social Gatherings with Friends and Family: Not on file  . Attends Religious Services: Not on file  . Active Member of Clubs or Organizations: Not on file  . Attends Banker Meetings: Not on file  . Marital Status: Not on file   No Known Allergies Family History  Problem Relation Age of Onset  . Hypertension Mother   . Heart disease Maternal Grandfather   . Cancer Maternal Grandfather   . Cancer Paternal Grandfather   . Cancer Father        Esophageal cancer  . Cancer Maternal Grandmother        liver and breast         Current Outpatient Medications (Other):  .  calcium carbonate (TUMS - DOSED IN MG ELEMENTAL CALCIUM) 500 MG chewable tablet, Chew 1-2 tablets by mouth 3 (three) times daily as needed for indigestion or heartburn. .  Omega-3 Fatty Acids (FISH OIL PO), Take 1 tablet by mouth daily. .  Prenatal Vit-Fe Fumarate-FA (MULTIVITAMIN-PRENATAL) 27-0.8 MG TABS tablet, Take 1 tablet by mouth daily at 12 noon. .  Vitamin D, Ergocalciferol,  (DRISDOL) 1.25 MG (50000 UT) CAPS capsule, Take 1 capsule (50,000 Units total) by mouth every 7 (seven) days.    Past medical history, social, surgical and family history all reviewed in electronic medical record.  No pertanent information unless stated regarding to the chief complaint.   Review of Systems:  No headache, visual changes, nausea, vomiting, diarrhea, constipation, dizziness, abdominal pain, skin rash, fevers, chills, night sweats, weight loss, swollen lymph nodes, body aches, joint swelling, muscle aches, chest pain, shortness of breath, mood changes.   Objective  Blood pressure 110/72, pulse (!) 54, height 5\' 5"  (1.651 m), weight 158 lb (71.7 kg), last menstrual period 09/29/2018, SpO2 99 %, currently breastfeeding.    General: No apparent distress alert and oriented x3 mood and affect normal, dressed appropriately.  HEENT: Pupils equal, extraocular movements intact  Respiratory: Patient's speak in full sentences and does not appear short of breath  Cardiovascular: No lower extremity edema, non tender, no erythema  Skin: Warm dry intact with no signs of infection or rash on extremities or on axial skeleton.  Abdomen: Soft and gravid Neuro: Cranial nerves II through XII are intact, neurovascularly intact in all extremities with 2+ DTRs and 2+ pulses.  Lymph: No lymphadenopathy of posterior or anterior cervical chain or axillae bilaterally.  Gait normal with good balance and coordination.  MSK:  Non tender with full range of motion and good stability and symmetric strength and tone of shoulders, elbows, wrist, hip, knee and ankles bilaterally.  Knee: Right knee Effusion noted.  Patient does have some mild limited knee flexion and lacks the last 2 degrees of extension.  Patient does have arthritic changes with instability noted.  After informed written and verbal consent, patient was seated on exam table. Right knee was prepped with alcohol swab and utilizing anterolateral  approach, patient's right knee space was injected with with 6 mL total of Synvisc 1 hyaluronic acid after aspiration of 30 cc of straw-colored fluid with a 21-gauge 2 inch needle..Patient tolerated the procedure well without immediate complications.    Impression and Recommendations:     This case required medical decision making of moderate complexity. The above documentation has been reviewed and is accurate and complete Lyndal Pulley, DO       Note: This dictation was prepared with Dragon dictation along with smaller phrase technology. Any transcriptional errors that result from this process are unintentional.

## 2019-02-21 ENCOUNTER — Encounter: Payer: Self-pay | Admitting: Family Medicine

## 2019-02-21 ENCOUNTER — Ambulatory Visit: Payer: Self-pay | Admitting: Family Medicine

## 2019-02-21 ENCOUNTER — Other Ambulatory Visit: Payer: Self-pay

## 2019-02-21 DIAGNOSIS — M1711 Unilateral primary osteoarthritis, right knee: Secondary | ICD-10-CM

## 2019-02-21 NOTE — Patient Instructions (Addendum)
Synvisc one injection today Congrats Call me when you need me

## 2019-02-21 NOTE — Assessment & Plan Note (Signed)
Repeat injection given again today.  Patient did tolerate the procedure well and did have aspiration of a significant amount of straw-colored colored fluid.  Patient does have some instability of the knee and will need to continue to monitor.  Patient is pregnant at the moment to be warned that this medication has not been studied in pregnancy.  Athletic trainer was at bedside during the discussion.  Patient will continue all other conservative therapy and can repeat these injections if necessary but hopefully this will be a good with another 6 to 12 months of improvement.

## 2019-03-04 ENCOUNTER — Encounter: Payer: Self-pay | Admitting: Certified Nurse Midwife

## 2019-03-11 ENCOUNTER — Other Ambulatory Visit: Payer: Self-pay

## 2019-03-11 ENCOUNTER — Ambulatory Visit (INDEPENDENT_AMBULATORY_CARE_PROVIDER_SITE_OTHER): Payer: Self-pay | Admitting: Certified Nurse Midwife

## 2019-03-11 ENCOUNTER — Encounter: Payer: Self-pay | Admitting: Certified Nurse Midwife

## 2019-03-11 VITALS — BP 123/76 | HR 90 | Wt 164.0 lb

## 2019-03-11 DIAGNOSIS — Z3A23 23 weeks gestation of pregnancy: Secondary | ICD-10-CM

## 2019-03-11 DIAGNOSIS — Z3480 Encounter for supervision of other normal pregnancy, unspecified trimester: Secondary | ICD-10-CM

## 2019-03-11 DIAGNOSIS — Z3482 Encounter for supervision of other normal pregnancy, second trimester: Secondary | ICD-10-CM

## 2019-03-11 DIAGNOSIS — Z2839 Other underimmunization status: Secondary | ICD-10-CM | POA: Insufficient documentation

## 2019-03-11 DIAGNOSIS — Z283 Underimmunization status: Secondary | ICD-10-CM | POA: Insufficient documentation

## 2019-03-11 NOTE — Progress Notes (Addendum)
   PRENATAL VISIT NOTE  Subjective:  Holly Wright is a 39 y.o. G4P3003 at [redacted]w[redacted]d being seen today for ongoing prenatal care.  She is currently monitored for the following issues for this low-risk pregnancy and has Umbilical hernia; H/O: depression; H/O pyelonephritis; Active labor at term; Vaginal delivery; Patellofemoral arthritis of right knee; Maternal age 21+, multigravida; and Supervision of other normal pregnancy, antepartum on their problem list.  Patient reports no complaints.  Contractions: Irritability. Vag. Bleeding: None.  Movement: Present. Denies leaking of fluid.   The following portions of the patient's history were reviewed and updated as appropriate: allergies, current medications, past family history, past medical history, past social history, past surgical history and problem list.   Objective:   Vitals:   03/11/19 0949  BP: 123/76  Pulse: 90  Weight: 74.4 kg    Fetal Status: Fetal Heart Rate (bpm): 136 Fundal Height: 23 cm Movement: Present     General:  Alert, oriented and cooperative. Patient is in no acute distress.  Skin: Skin is warm and dry. No rash noted.   Cardiovascular: Normal heart rate noted  Respiratory: Normal respiratory effort, no problems with respiration noted  Abdomen: Soft, gravid, appropriate for gestational age.  Pain/Pressure: Absent     Pelvic: Cervical exam deferred        Extremities: Normal range of motion.  Edema: None  Mental Status: Normal mood and affect. Normal behavior. Normal judgment and thought content.   Assessment and Plan:  Pregnancy: G4P3003 at [redacted]w[redacted]d 1. Supervision of other normal pregnancy, antepartum - Patient here for routine OB. Patient has no complaints.  - Patient has missed a few appointments. Last appointment was rescheduled last week due to the weather. Anatomy ultrasound ordered.  - Patient still planning home birth with unlicensed midwife.  - Anticipatory guidance for upcoming appointments. GTT and labs at  next visit.   - [place] MFM OB DETAILED + 14 WK; Future  Preterm labor symptoms and general obstetric precautions including but not limited to vaginal bleeding, contractions, leaking of fluid and fetal movement were reviewed in detail with the patient. Please refer to After Visit Summary for other counseling recommendations.   Return in about 5 weeks (around 04/15/2019).   Camelia Eng, SNM  I confirm that I have verified the information documented in the nurse midwife student's note and that I have also personally reperformed the history, physical exam and all medical decision making activities of this service and have verified that all service and findings are accurately documented in this student's note.    Donette Larry, CNM 03/11/2019 11:32 AM

## 2019-04-01 ENCOUNTER — Other Ambulatory Visit: Payer: Self-pay

## 2019-04-04 ENCOUNTER — Ambulatory Visit (HOSPITAL_COMMUNITY): Payer: Self-pay

## 2019-04-05 ENCOUNTER — Other Ambulatory Visit: Payer: Self-pay

## 2019-04-05 ENCOUNTER — Ambulatory Visit (HOSPITAL_COMMUNITY)
Admission: RE | Admit: 2019-04-05 | Discharge: 2019-04-05 | Disposition: A | Discharge status: Home / Self Care | Payer: Self-pay | Source: Ambulatory Visit | Attending: Certified Nurse Midwife | Admitting: Certified Nurse Midwife

## 2019-04-05 DIAGNOSIS — Z3480 Encounter for supervision of other normal pregnancy, unspecified trimester: Secondary | ICD-10-CM | POA: Insufficient documentation

## 2019-04-05 DIAGNOSIS — O09522 Supervision of elderly multigravida, second trimester: Secondary | ICD-10-CM

## 2019-04-05 DIAGNOSIS — Z3A26 26 weeks gestation of pregnancy: Secondary | ICD-10-CM

## 2019-04-15 ENCOUNTER — Encounter: Payer: Self-pay | Admitting: Certified Nurse Midwife

## 2019-04-19 ENCOUNTER — Ambulatory Visit (HOSPITAL_COMMUNITY): Payer: Self-pay

## 2020-08-21 ENCOUNTER — Ambulatory Visit: Payer: Self-pay | Admitting: Family Medicine

## 2020-08-28 ENCOUNTER — Ambulatory Visit: Payer: Self-pay | Admitting: Family Medicine

## 2020-09-14 IMAGING — US US MFM OB DETAIL+14 WK
1 series · 13 of 28 positions shown · non-contrast
Comparison: none

[Series 1: us mfm ob detail+14 wk · 13 of 116 slices shown]
[im 5/116]
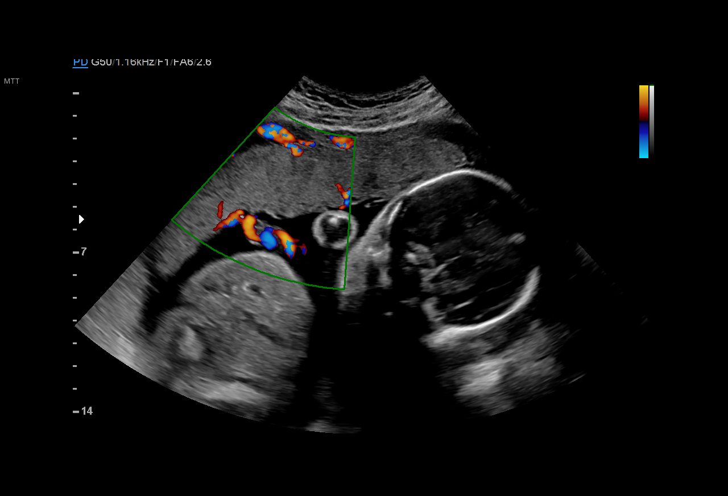
[im 13/116]
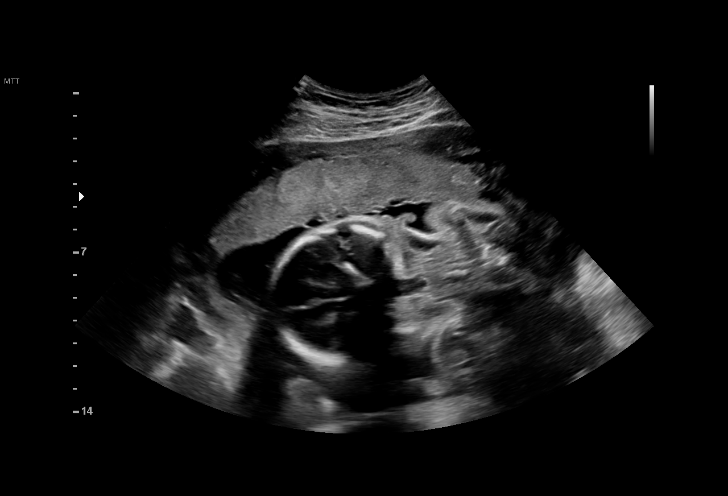
[im 22/116]
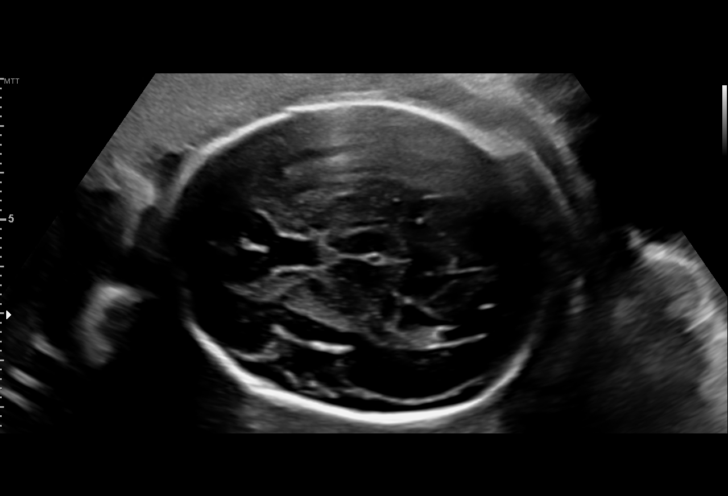
[im 30/116]
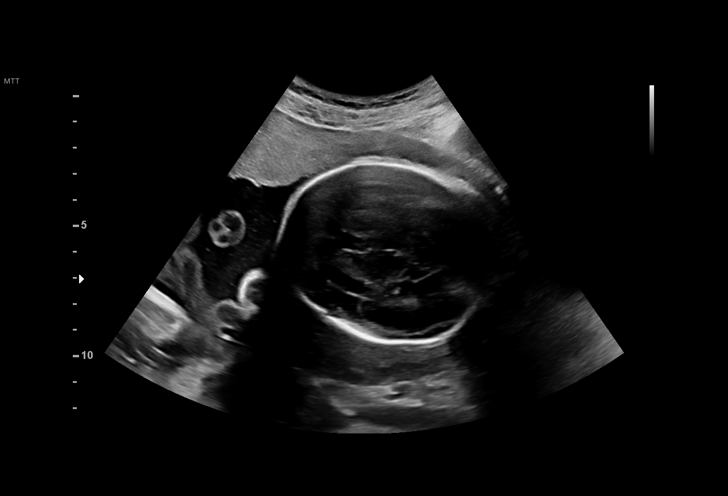
[im 39/116]
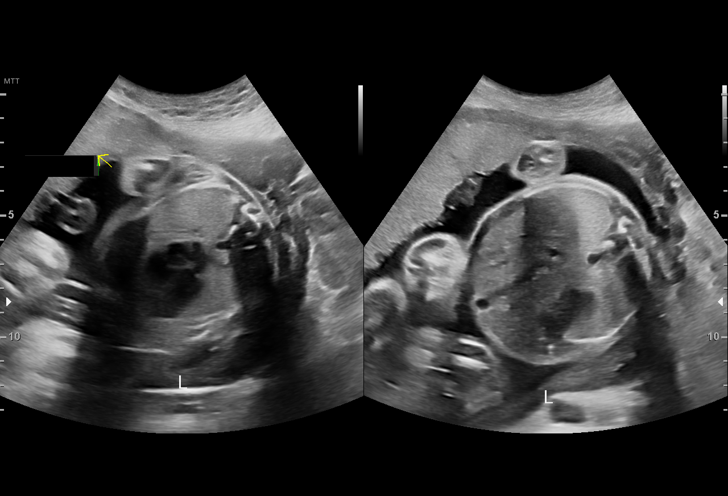
[im 47/116]
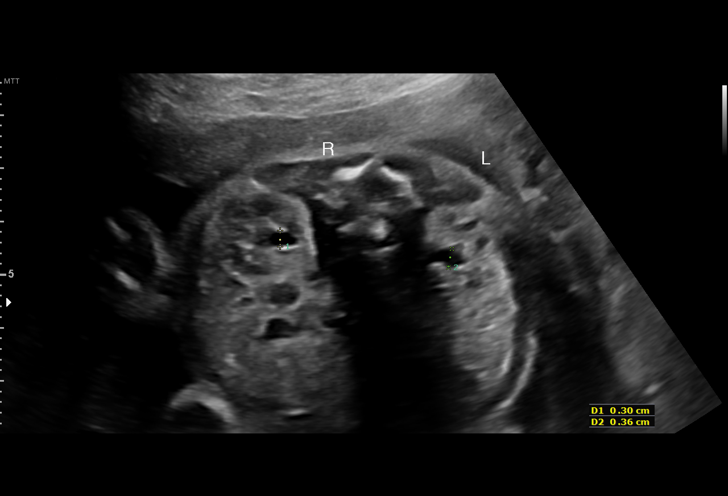
[im 60/116]
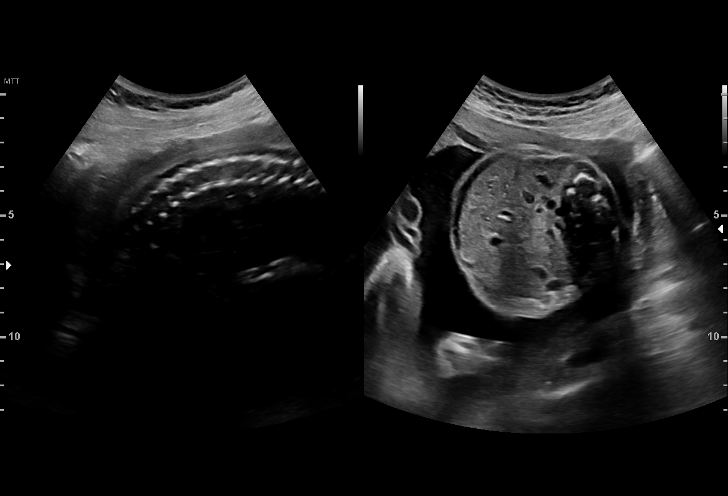
[im 69/116]
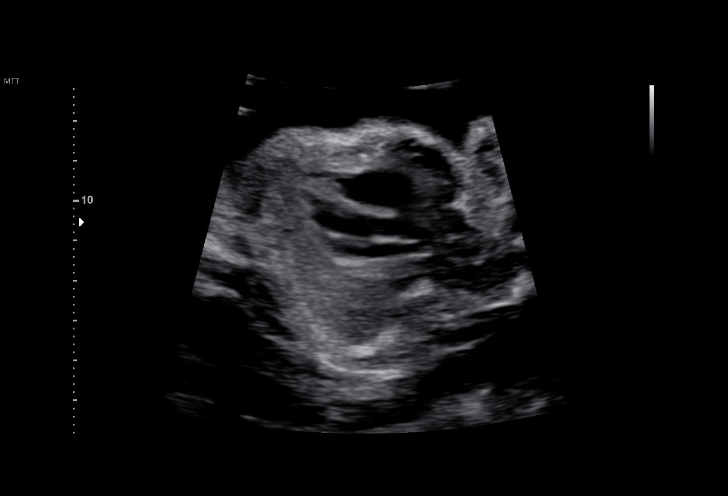
[im 77/116]
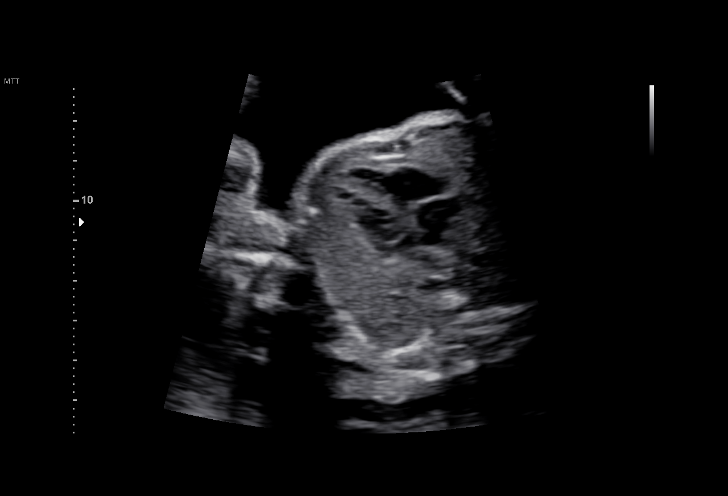
[im 86/116]
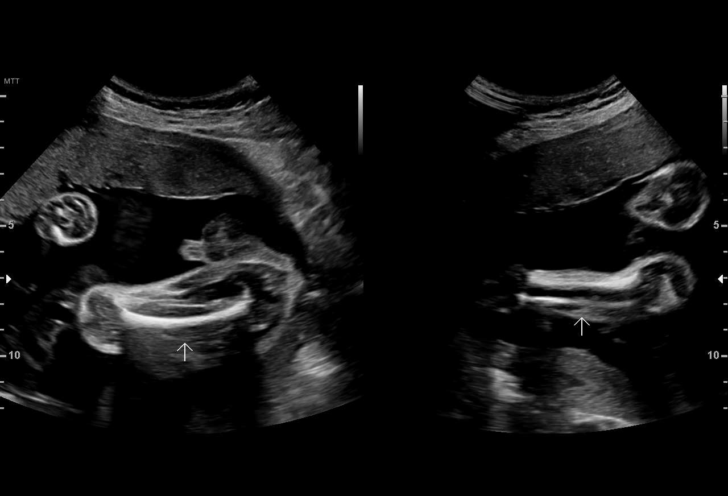
[im 94/116]
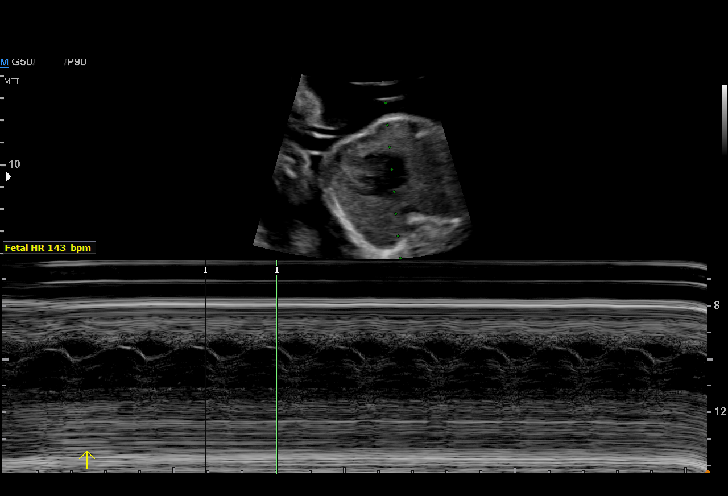
[im 103/116]
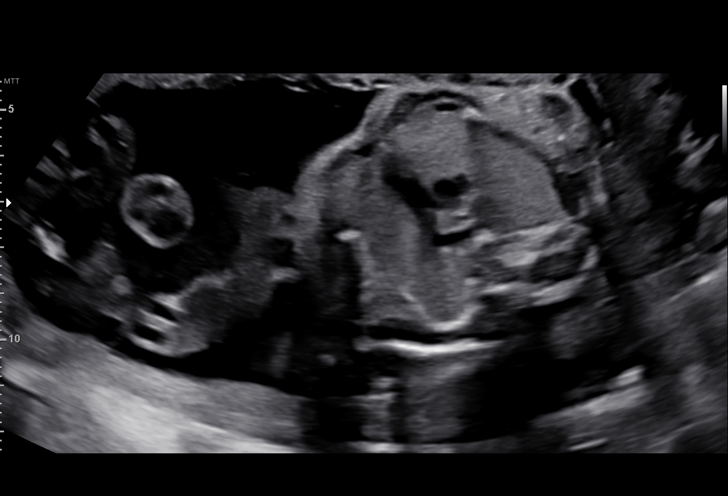
[im 111/116]
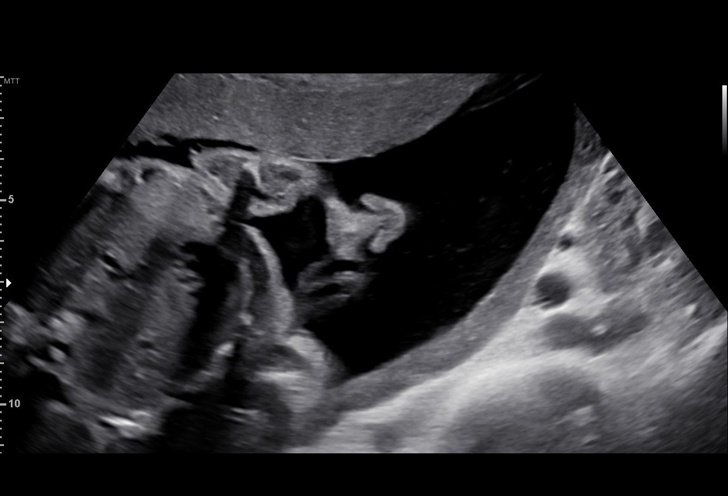

[13 of 28 positions shown; findings below may reference images not displayed]

----------------------------------------------------------------------

 ----------------------------------------------------------------------
Indications

  Advanced maternal age multigravida 35+,
  second trimester
  26 weeks gestation of pregnancy
  Encounter for antenatal screening for
  malformations
 ----------------------------------------------------------------------
Fetal Evaluation

 Num Of Fetuses:         1
 Fetal Heart Rate(bpm):  143
 Cardiac Activity:       Observed
 Presentation:           Cephalic
 Placenta:               Anterior
 P. Cord Insertion:      Visualized, central

 Amniotic Fluid
 AFI FV:      Within normal limits

                             Largest Pocket(cm)

Biometry

 BPD:      67.4  mm     G. Age:  27w 1d         49  %    CI:        80.42   %    70 - 86
                                                         FL/HC:      20.9   %    18.6 -
 HC:      237.4  mm     G. Age:  25w 6d          4  %    HC/AC:      1.01        1.05 -
 AC:       234   mm     G. Age:  27w 5d         69  %    FL/BPD:     73.6   %    71 - 87
 FL:       49.6  mm     G. Age:  26w 5d         31  %    FL/AC:      21.2   %    20 - 24
 HUM:      44.1  mm     G. Age:  26w 1d         32  %
 CER:      31.2  mm     G. Age:  27w 1d         57  %

 LV:        3.7  mm
 CM:        4.5  mm

 Est. FW:    3428  gm      2 lb 5 oz     50  %
OB History

 Blood Type:    B+
 Gravidity:    4         Term:   3        Prem:   0        SAB:   0
 TOP:          0       Ectopic:  0        Living: 3
Gestational Age

 LMP:           26w 6d        Date:  09/29/18                 EDD:   07/06/19
 U/S Today:     26w 6d                                        EDD:   07/06/19
 Best:          26w 6d     Det. By:  LMP  (09/29/18)          EDD:   07/06/19
Anatomy

 Cranium:               Appears normal         LVOT:                   Appears normal
 Cavum:                 Appears normal         Aortic Arch:            Appears normal
 Ventricles:            Appears normal         Ductal Arch:            Appears normal
 Choroid Plexus:        Appears normal         Diaphragm:              Appears normal
 Cerebellum:            Appears normal         Stomach:                Appears normal, left
                                                                       sided
 Posterior Fossa:       Appears normal         Abdomen:                Appears normal
 Nuchal Fold:           Not applicable (>20    Abdominal Wall:         Appears nml (cord
                        wks GA)                                        insert, abd wall)
 Face:                  Appears normal         Cord Vessels:           Appears normal (3
                        (orbits and profile)                           vessel cord)
 Lips:                  Appears normal         Kidneys:                Appear normal
 Palate:                Not well visualized    Bladder:                Appears normal
 Thoracic:              Appears normal         Spine:                  Appears normal
 Heart:                 Appears normal         Upper Extremities:      Appears normal
                        (4CH, axis, and
                        situs)
 RVOT:                  Appears normal         Lower Extremities:      Appears normal

 Other:  Male gender. Nasal bone visualized. Heels visualized. Technically
         difficult due to fetal position.
Cervix Uterus Adnexa

 Cervix
 Length:           4.49  cm.
 Normal appearance by transabdominal scan.

 Uterus
 No abnormality visualized.

 Left Ovary
 Within normal limits. No adnexal mass visualized.

 Right Ovary
 Within normal limits. No adnexal mass visualized.

 Cul De Sac
 No free fluid seen.

 Adnexa
 No abnormality visualized.
Comments

 This patient was seen for a detailed fetal anatomy scan due
 to advanced maternal age. She denies any significant past
 medical history and denies any problems in her current
 pregnancy.  She is planning a home birth.
 She had a cell free DNA test earlier in her pregnancy which
 indicated a low risk for trisomy 21, 18, and 13. A male fetus is
 predicted.
 She was informed that the fetal growth and amniotic fluid
 level were appropriate for her gestational age.
 There were no obvious fetal anomalies noted on today's
 ultrasound exam.
 The patient was informed that anomalies may be missed due
 to technical limitations. If the fetus is in a suboptimal position
 or maternal habitus is increased, visualization of the fetus in
 the maternal uterus may be impaired.
 Follow up as indicated.

## 2020-11-02 NOTE — Progress Notes (Signed)
[facility] [address] [place] [address] Phone: [phone] Subjective:   Holly Wright, am serving as a scribe for Dr. Antoine Primas. This visit occurred during the SARS-CoV-2 public health emergency.  Safety protocols were in place, including screening questions prior to the visit, additional usage of staff PPE, and extensive cleaning of exam room while observing appropriate contact time as indicated for disinfecting solutions.    I'm seeing this patient by the request  of:  Etta Quill, PA-C  CC: right knee pain   [id number]  02/21/2019 Repeat injection given again today.  Patient did tolerate the procedure well and did have aspiration of a significant amount of straw-colored colored fluid.  Patient does have some instability of the knee and will need to continue to monitor.  Patient is pregnant at the moment to be warned that this medication has not been studied in pregnancy.  Athletic trainer was at bedside during the discussion.  Patient will continue all other conservative therapy and can repeat these injections if necessary but hopefully this will be a good with another 6 to 12 months of improvement.  Update 11/05/2020 Holly Wright is a 40 y.o. female coming in with complaint of R knee pain. Patient states that she is able to cycle but did try jogging for 10 minutes the other week and pain and swelling. Pain over lateral patella. Patient has been using brace and elevation for pain relief.       Past Medical History:  Diagnosis Date   Depression    Pyelonephritis    Tension headache    Umbilical hernia 2013   Past Surgical History:  Procedure Laterality Date   BREAST ENHANCEMENT SURGERY  08/24/2018   EYE SURGERY     LASIK     Social History   Socioeconomic History   Marital status: Married    Spouse name: Not on file   Number of children: Not on file   Years of education: Not on file   Highest education level: Not  on file  Occupational History   Not on file  Tobacco Use   Smoking status: Former    Types: Cigarettes    Quit date: 09/11/2010    Years since quitting: 10.1   Smokeless tobacco: Never  Substance and Sexual Activity   Alcohol use: Not Currently    Comment: Occas social use   Drug use: Not Currently    Types: Marijuana    Comment: Last use 4/12 and occas at that time   Sexual activity: Yes    Partners: Male  Other Topics Concern   Not on file  Social History Narrative   Not on file   Social Determinants of Health   Financial Resource Strain: Not on file  Food Insecurity: Not on file  Transportation Needs: Not on file  Physical Activity: Not on file  Stress: Not on file  Social Connections: Not on file   No Known Allergies Family History  Problem Relation Age of Onset   Hypertension Mother    Heart disease Maternal Grandfather    Cancer Maternal Grandfather    Cancer Paternal Grandfather    Cancer Father        Esophageal cancer   Cancer Maternal Grandmother        liver and breast       Current Outpatient Medications (Analgesics):    meloxicam (MOBIC) 15 MG tablet, Take 1 tablet (15 mg total) by mouth daily.  Current Outpatient Medications (Other):    calcium carbonate (TUMS - DOSED IN MG ELEMENTAL CALCIUM) 500 MG chewable tablet, Chew 1-2 tablets by mouth 3 (three) times daily as needed for indigestion or heartburn.   Omega-3 Fatty Acids (FISH OIL PO), Take 1 tablet by mouth daily.   Prenatal Vit-Fe Fumarate-FA (MULTIVITAMIN-PRENATAL) 27-0.8 MG TABS tablet, Take 1 tablet by mouth daily at 12 noon.   Vitamin D, Ergocalciferol, (DRISDOL) 1.25 MG (50000 UNIT) CAPS capsule, Take 1 capsule (50,000 Units total) by mouth every 7 (seven) days.   Vitamin D, Ergocalciferol, (DRISDOL) 1.25 MG (50000 UT) CAPS capsule, Take 1 capsule (50,000 Units total) by mouth every 7 (seven) days.   Reviewed prior external information including notes and imaging from  primary care  provider As well as notes that were available from care everywhere and other healthcare systems.  Past medical history, social, surgical and family history all reviewed in electronic medical record.  No pertanent information unless stated regarding to the chief complaint.   Review of Systems:  No headache, visual changes, nausea, vomiting, diarrhea, constipation, dizziness, abdominal pain, skin rash, fevers, chills, night sweats, weight loss, swollen lymph nodes, body aches, joint swelling, chest pain, shortness of breath, mood changes. POSITIVE muscle aches  Objective  Blood pressure 110/82, pulse 82, height 5\' 5"  (1.651 m), weight 157 lb (71.2 kg), last menstrual period 10/22/2020, SpO2 99 %, unknown if currently breastfeeding.   General: No apparent distress alert and oriented x3 mood and affect normal, dressed appropriately.  HEENT: Pupils equal, extraocular movements intact  Respiratory: Patient's speak in full sentences and does not appear short of breath  Cardiovascular: No lower extremity edema, non tender, no erythema  Gait normal with good balance and coordination.  MSK:  right knee effusion noted. Full ROM mild crepitus, mild positive grind of the patella No significant instability noted.  Limited muscular skeletal ultrasound was performed and interpreted by 10/24/2020, M  Limited ultrasound of the right knee shows the patient still has effusion noted of the patellofemoral joint.  Mild to moderate narrowing noted.  Patient's lateral meniscus does have some calcific deposits noted but no true significant displacement. Impression: Patellofemoral arthritis with small effusion and reactive synovitis   Impression and Recommendations:     The above documentation has been reviewed and is accurate and complete Antoine Primas, DO

## 2020-11-05 ENCOUNTER — Ambulatory Visit (INDEPENDENT_AMBULATORY_CARE_PROVIDER_SITE_OTHER): Payer: Self-pay

## 2020-11-05 ENCOUNTER — Ambulatory Visit (INDEPENDENT_AMBULATORY_CARE_PROVIDER_SITE_OTHER): Payer: Self-pay | Admitting: Family Medicine

## 2020-11-05 ENCOUNTER — Other Ambulatory Visit: Payer: Self-pay

## 2020-11-05 ENCOUNTER — Encounter: Payer: Self-pay | Admitting: Family Medicine

## 2020-11-05 ENCOUNTER — Ambulatory Visit: Payer: Self-pay

## 2020-11-05 VITALS — BP 110/82 | HR 82 | Ht 65.0 in | Wt 157.0 lb

## 2020-11-05 DIAGNOSIS — M25561 Pain in right knee: Secondary | ICD-10-CM

## 2020-11-05 DIAGNOSIS — G8929 Other chronic pain: Secondary | ICD-10-CM

## 2020-11-05 DIAGNOSIS — M1711 Unilateral primary osteoarthritis, right knee: Secondary | ICD-10-CM

## 2020-11-05 MED ORDER — MELOXICAM 15 MG PO TABS: 15.0000 mg | ORAL_TABLET | Freq: Every day | ORAL | 0 refills | Status: AC

## 2020-11-05 MED ORDER — VITAMIN D (ERGOCALCIFEROL) 1.25 MG (50000 UNIT) PO CAPS: 50000.0000 [IU] | ORAL_CAPSULE | ORAL | 0 refills | Status: AC

## 2020-11-05 NOTE — Assessment & Plan Note (Signed)
Patellofemoral arthritis.  Discussed HEP  Discussed bracing  Patient will do HEP meloxicam, tart cherry. Does have synovitis. Discussed MRI but being self pay patient declined.  If no improvement will do injection  RTC in 4-6 weeks

## 2020-11-05 NOTE — Patient Instructions (Addendum)
Xray today Meloxicam 15mg  for 10 days then as needed Vit D once a week See me in 5-6 weeks

## 2020-11-07 ENCOUNTER — Encounter: Payer: Self-pay | Admitting: Family Medicine

## 2020-12-11 ENCOUNTER — Ambulatory Visit: Payer: Self-pay | Admitting: Family Medicine

## 2021-01-29 NOTE — Progress Notes (Signed)
[facility]olly [facility] D.[facility] [address] [place] [address] Phone: [phone]   Assessment and Plan:     1. Chronic pain of right knee 2. Patellofemoral arthritis of right knee -Chronic exacerbation, subsequent visit - Recurrence of typical knee pain with swelling overall decreasing over the past 2 weeks. - No significant worsening of symptoms, no red flag symptoms, no new mechanism of injury, so no additional imaging obtained today - Patient elects for HA injection as she has received benefit from this injection in the past.  Tolerated well per note below - Patient received relief from CSI intra-articular knee at Ortho clinic with relief of knee pain, however had side effects of irritation from steroid and would prefer not to repeat CSI - Tylenol/NSAIDs as needed for pain control   Procedure: Knee Joint Injection Side: Right Indication: Right knee pain with mild OA  Risks explained and consent was given verbally. The site was cleaned with alcohol prep. A needle was introduced with an anterio-lateral approach. Injection given using Synvisc 6 mL. This was well tolerated and resulted in symptomatic relief.  Needle was removed, hemostasis achieved, and post injection instructions were explained.   Pt was advised to call or return to clinic if these symptoms worsen or fail to improve as anticipated.   Pertinent previous records reviewed include Ortho note from 11/10/2020, x-ray knee 10/2020 as needed if no improvement or worsening of symptoms   Follow Up: As needed no improvement or worsening of symptoms   Subjective:    I, Holly Wright, am serving as a Neurosurgeon for Doctor [employer]  Chief Complaint: Right knee swelling  HPI:   02/21/2019 Repeat injection given again today.  Patient did tolerate the procedure well and did have aspiration of a significant amount of straw-colored colored fluid.  Patient does have some instability of the  knee and will need to continue to monitor.  Patient is pregnant at the moment to be warned that this medication has not been studied in pregnancy.  Athletic trainer was at bedside during the discussion.  Patient will continue all other conservative therapy and can repeat these injections if necessary but hopefully this will be a good with another 6 to 12 months of improvement.   Update 11/05/2020 Holly Wright is a 40 y.o. female coming in with complaint of R knee pain. Patient states that she is able to cycle but did try jogging for 10 minutes the other week and pain and swelling. Pain over lateral patella. Patient has been using brace and elevation for pain relief.   01/30/2021 Patient states knee is doing better than it has been in the past has been slow and mild swelling for about 10/11 days. Saturday after last visit 11/05/20 had fluid drained off and steroid injection helped knee but had side effects that made her "angry"     Relevant Historical Information: None pertinent  Additional pertinent review of systems negative.   Current Outpatient Medications:    calcium carbonate (TUMS - DOSED IN MG ELEMENTAL CALCIUM) 500 MG chewable tablet, Chew 1-2 tablets by mouth 3 (three) times daily as needed for indigestion or heartburn., Disp: , Rfl:    meloxicam (MOBIC) 15 MG tablet, Take 1 tablet (15 mg total) by mouth daily., Disp: 30 tablet, Rfl: 0   Omega-3 Fatty Acids (FISH OIL PO), Take 1 tablet by mouth daily., Disp: , Rfl:    Prenatal Vit-Fe Fumarate-FA (MULTIVITAMIN-PRENATAL) 27-0.8 MG TABS tablet, Take 1  tablet by mouth daily at 12 noon., Disp: , Rfl:    Vitamin D, Ergocalciferol, (DRISDOL) 1.25 MG (50000 UNIT) CAPS capsule, Take 1 capsule (50,000 Units total) by mouth every 7 (seven) days., Disp: 12 capsule, Rfl: 0   Vitamin D, Ergocalciferol, (DRISDOL) 1.25 MG (50000 UT) CAPS capsule, Take 1 capsule (50,000 Units total) by mouth every 7 (seven) days., Disp: 12 capsule, Rfl: 0   Objective:      Vitals:   01/30/21 1009  BP: 110/80  Pulse: 92  SpO2: 96%  Weight: 153 lb (69.4 kg)  Height: 5\' 5"  (1.651 m)      Body mass index is 25.46 kg/m.    Physical Exam:    General:  awake, alert oriented, no acute distress nontoxic Skin: no suspicious lesions or rashes Neuro:sensation intact, no deficits, strength 5/5 with no deficits, no atrophy, normal muscle tone Psych: No signs of anxiety, depression or other mood disorder  Knee: Mild swelling No deformity Neg fluid wave, joint milking ROM Flex 110, Ext 0 NTTP over the quad tendon, medial fem condyle, lat fem condyle, patella, plica, patella tendon, tibial tuberostiy, fibular head, posterior fossa, pes anserine bursa, gerdy's tubercle, medial jt line, lateral jt line   Gait normal    Electronically signed by:  [facility] 10:58 AM 01/30/21

## 2021-01-30 ENCOUNTER — Other Ambulatory Visit: Payer: Self-pay

## 2021-01-30 ENCOUNTER — Ambulatory Visit (INDEPENDENT_AMBULATORY_CARE_PROVIDER_SITE_OTHER): Payer: Self-pay | Admitting: Sports Medicine

## 2021-01-30 VITALS — BP 110/80 | HR 92 | Ht 65.0 in | Wt 153.0 lb

## 2021-01-30 DIAGNOSIS — M25561 Pain in right knee: Secondary | ICD-10-CM

## 2021-01-30 DIAGNOSIS — M1711 Unilateral primary osteoarthritis, right knee: Secondary | ICD-10-CM

## 2021-01-30 DIAGNOSIS — G8929 Other chronic pain: Secondary | ICD-10-CM

## 2021-01-30 NOTE — Patient Instructions (Addendum)
Good to see you   

## 2021-02-15 ENCOUNTER — Encounter: Payer: Self-pay | Admitting: Sports Medicine

## 2022-04-17 IMAGING — DX DG KNEE 3 VIEWS*R*
3 series · 3 of 3 positions shown · non-contrast
Comparison: None.

CLINICAL DATA: Atraumatic chronic right knee pain.

EXAM:
RIGHT KNEE - 3 VIEW

[knee ap]
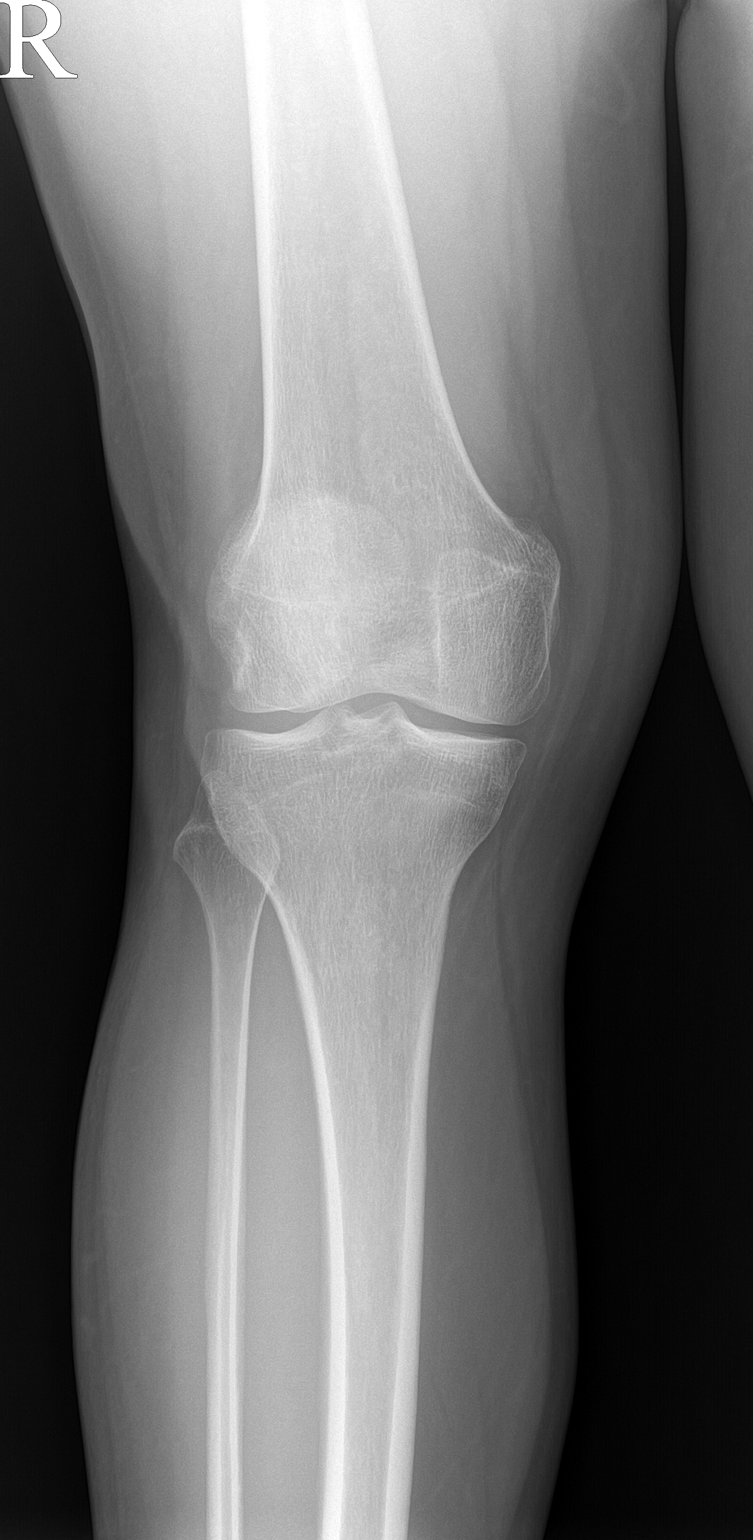

[knee lat]
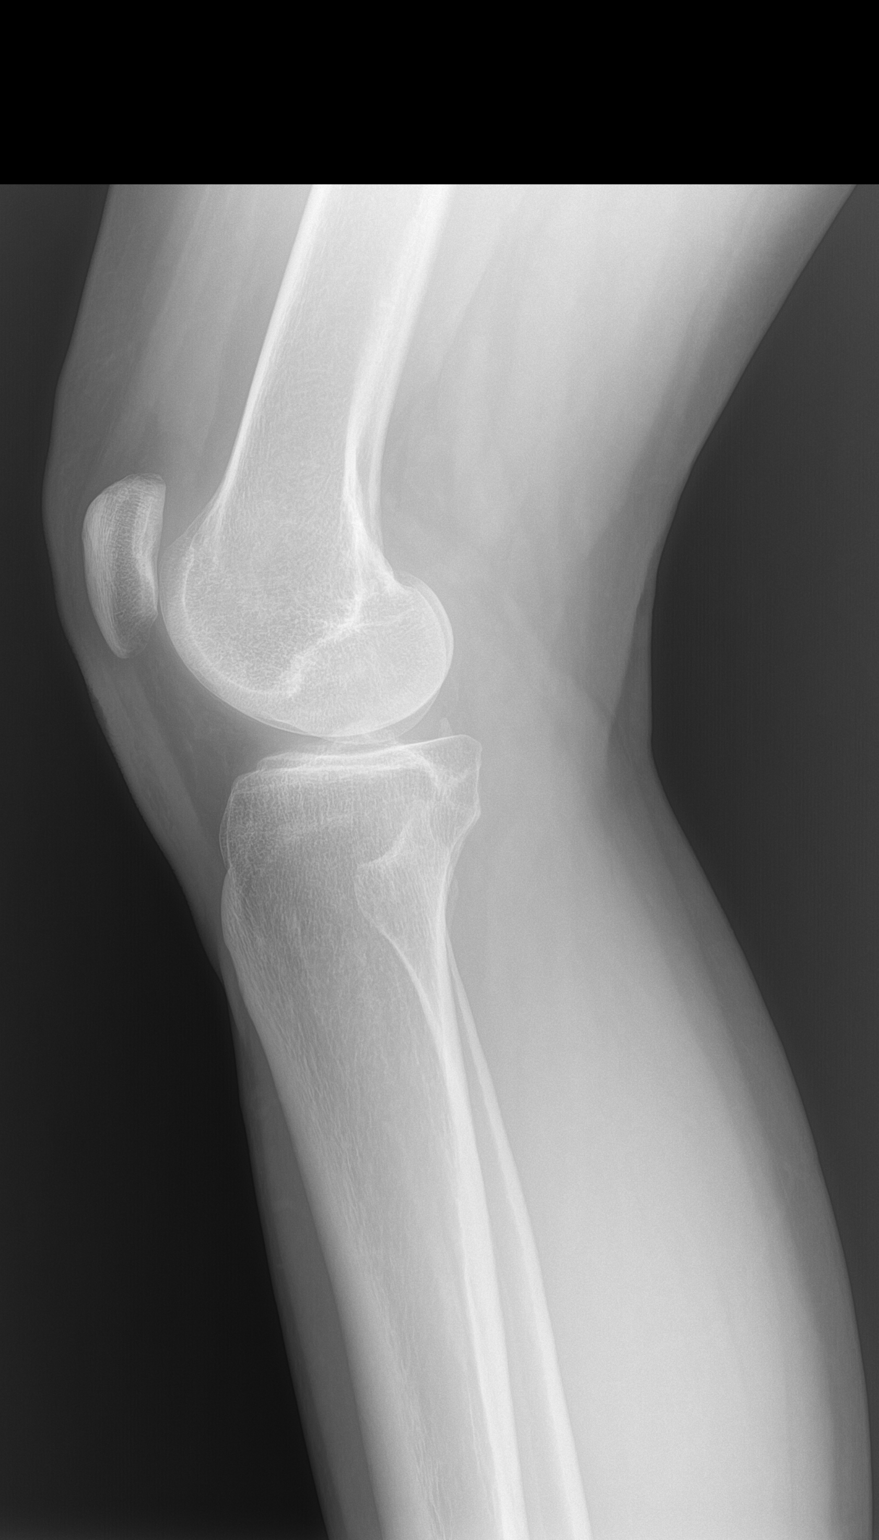

[patella]
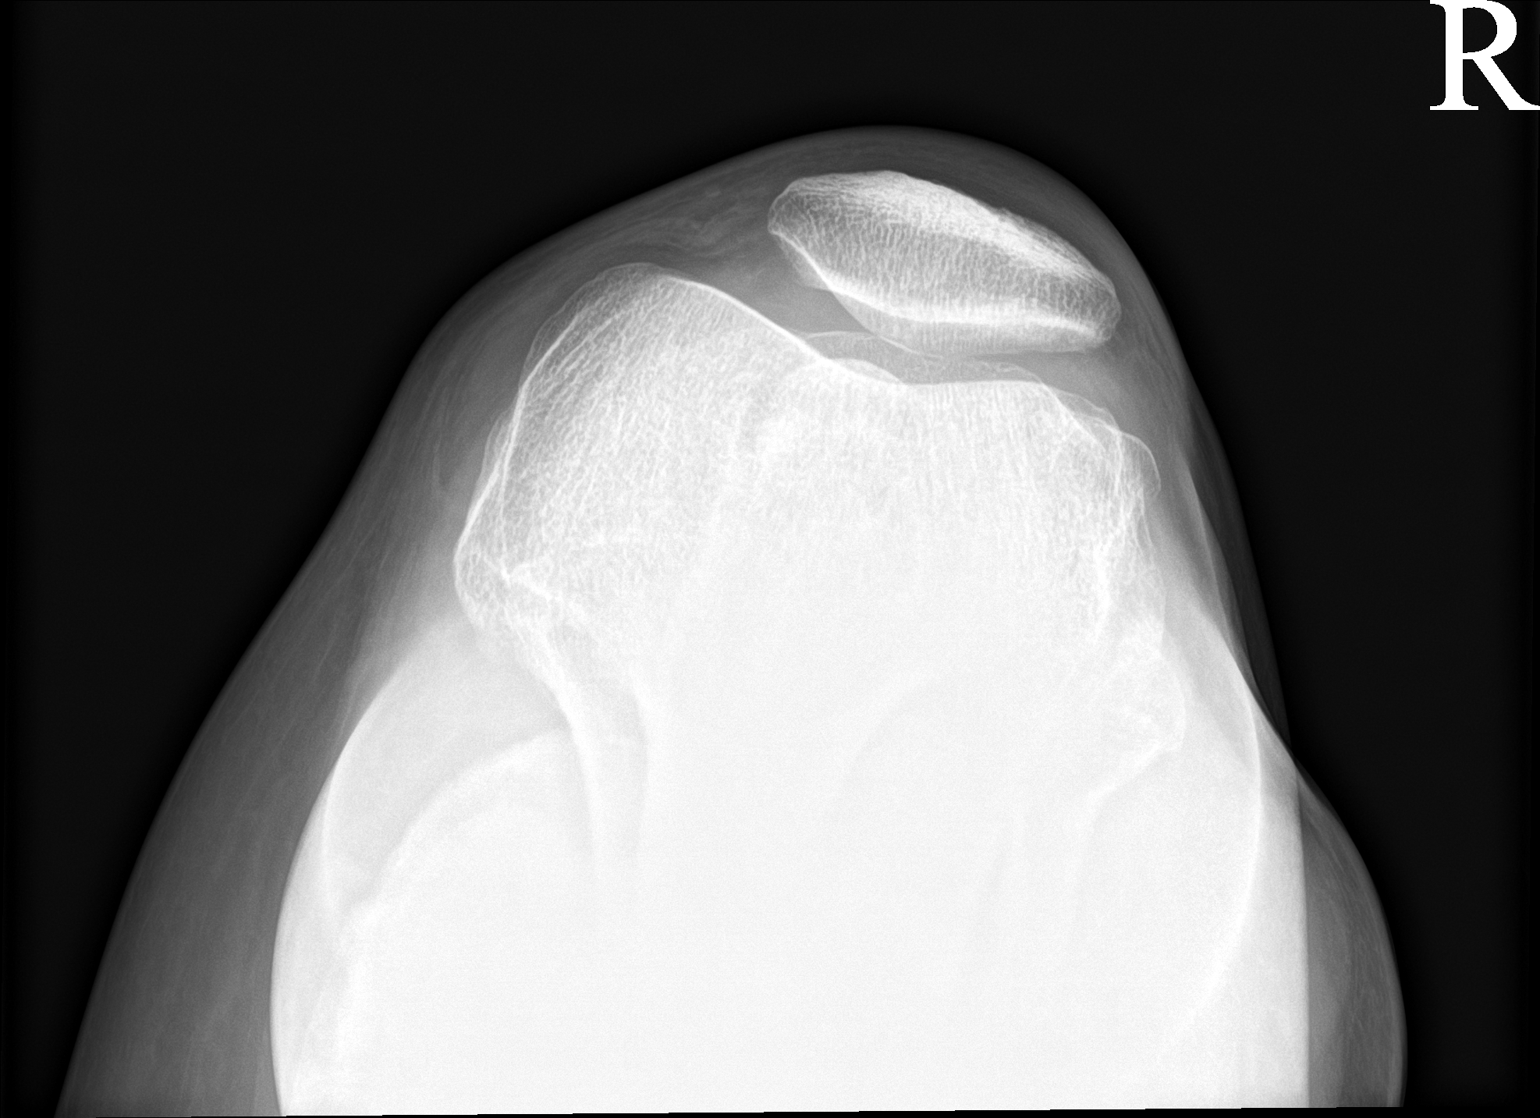

[3 of 3 positions shown; findings below may reference images not displayed]

FINDINGS: No evidence of an acute fracture or dislocation. Mild medial and
lateral tibiofemoral compartment space narrowing is seen. A small
joint effusion is noted.
IMPRESSION: Small joint effusion without an acute osseous abnormality. MRI
correlation is recommended.

## 2022-10-01 NOTE — Progress Notes (Signed)
    Aleen [facility] D.[facility] [address] [place] [address] Phone: [phone]   Assessment and Plan:     There are no diagnoses linked to this encounter.  ***   Pertinent previous records reviewed include ***   Follow Up: ***     Subjective:   I, [name], am serving as a Neurosurgeon for Doctor Richardean Sale   Chief Complaint: Right knee swelling   HPI:    02/21/2019 Repeat injection given again today.  Patient did tolerate the procedure well and did have aspiration of a significant amount of straw-colored colored fluid.  Patient does have some instability of the knee and will need to continue to monitor.  Patient is pregnant at the moment to be warned that this medication has not been studied in pregnancy.  Athletic trainer was at bedside during the discussion.  Patient will continue all other conservative therapy and can repeat these injections if necessary but hopefully this will be a good with another 6 to 12 months of improvement.   Update 11/05/2020 Holly Wright is a 42 y.o. female coming in with complaint of R knee pain. Patient states that she is able to cycle but did try jogging for 10 minutes the other week and pain and swelling. Pain over lateral patella. Patient has been using brace and elevation for pain relief.    01/30/2021 Patient states knee is doing better than it has been in the past has been slow and mild swelling for about 10/11 days. Saturday after last visit 11/05/20 had fluid drained off and steroid injection helped knee but had side effects that made her "angry"    10/02/2022 Patient states  Relevant Historical Information: None pertinent    Additional pertinent review of systems negative.   Current Outpatient Medications:    calcium carbonate (TUMS - DOSED IN MG ELEMENTAL CALCIUM) 500 MG chewable tablet, Chew 1-2 tablets by mouth 3 (three) times daily as needed for indigestion or heartburn., Disp: ,  Rfl:    meloxicam (MOBIC) 15 MG tablet, Take 1 tablet (15 mg total) by mouth daily., Disp: 30 tablet, Rfl: 0   Omega-3 Fatty Acids (FISH OIL PO), Take 1 tablet by mouth daily., Disp: , Rfl:    Prenatal Vit-Fe Fumarate-FA (MULTIVITAMIN-PRENATAL) 27-0.8 MG TABS tablet, Take 1 tablet by mouth daily at 12 noon., Disp: , Rfl:    Vitamin D, Ergocalciferol, (DRISDOL) 1.25 MG (50000 UNIT) CAPS capsule, Take 1 capsule (50,000 Units total) by mouth every 7 (seven) days., Disp: 12 capsule, Rfl: 0   Vitamin D, Ergocalciferol, (DRISDOL) 1.25 MG (50000 UT) CAPS capsule, Take 1 capsule (50,000 Units total) by mouth every 7 (seven) days., Disp: 12 capsule, Rfl: 0   Objective:     There were no vitals filed for this visit.    There is no height or weight on file to calculate BMI.    Physical Exam:    ***   Electronically signed by:  Aleen Sells [facility] 4:20 PM 10/01/22

## 2022-10-02 ENCOUNTER — Ambulatory Visit (INDEPENDENT_AMBULATORY_CARE_PROVIDER_SITE_OTHER): Payer: Self-pay | Admitting: Sports Medicine

## 2022-10-02 VITALS — BP 122/78 | HR 83 | Ht 65.0 in | Wt 151.0 lb

## 2022-10-02 DIAGNOSIS — M7711 Lateral epicondylitis, right elbow: Secondary | ICD-10-CM

## 2022-10-02 DIAGNOSIS — M25521 Pain in right elbow: Secondary | ICD-10-CM

## 2022-10-02 MED ORDER — MELOXICAM 15 MG PO TABS: 15.0000 mg | ORAL_TABLET | Freq: Every day | ORAL | 0 refills | Status: AC

## 2022-10-02 NOTE — Patient Instructions (Signed)
-   Start meloxicam 15 mg daily x2 weeks.  If still having pain after 2 weeks, complete 3rd-week of meloxicam. May use remaining meloxicam as needed once daily for pain control.  Do not to use additional NSAIDs while taking meloxicam.  May use Tylenol [phone] mg 2 to 3 times a day for breakthrough pain. Elbow HEP  4 week follow up

## 2023-04-13 ENCOUNTER — Ambulatory Visit: Payer: Self-pay | Admitting: Sports Medicine

## 2023-04-13 VITALS — BP 110/80 | HR 87 | Ht 65.0 in | Wt 158.0 lb

## 2023-04-13 DIAGNOSIS — M7651 Patellar tendinitis, right knee: Secondary | ICD-10-CM

## 2023-04-13 DIAGNOSIS — M25562 Pain in left knee: Secondary | ICD-10-CM

## 2023-04-13 DIAGNOSIS — M25561 Pain in right knee: Secondary | ICD-10-CM

## 2023-04-13 DIAGNOSIS — M7652 Patellar tendinitis, left knee: Secondary | ICD-10-CM

## 2023-04-13 MED ORDER — CELECOXIB 200 MG PO CAPS: 200.0000 mg | ORAL_CAPSULE | Freq: Two times a day (BID) | ORAL | 0 refills | Status: AC

## 2023-04-13 MED ORDER — MELOXICAM 15 MG PO TABS: 15.0000 mg | ORAL_TABLET | Freq: Every day | ORAL | 0 refills | Status: DC

## 2023-04-13 NOTE — Patient Instructions (Signed)
Celebrex 200 mg daily x2 daily .  If still having pain after 2 weeks, complete 3rd-week of NSAID. May use remaining NSAID as needed once daily for pain control.  Do not to use additional over-the-counter NSAIDs (ibuprofen, naproxen, Advil, Aleve) while taking prescription NSAIDs.  May use Tylenol [phone] mg 2 to 3 times a day for breakthrough pain. Knee HEP  As needed follow up

## 2023-04-13 NOTE — Progress Notes (Signed)
Holly [facility] D.[facility] [address] [place] [address] Phone: [phone]   Assessment and Plan:     1. Acute pain of right knee 2. Acute pain of left knee 3. Patellar tendinitis of both knees -Acute, uncomplicated, initial sports medicine visit - Consistent with bilateral, right worse than left, patellar tendinitis.  Likely flared due to restarting physical activity cycling after period of inactivity while recovering from the flu, drained immune system from flu - Start Celebrex 200 twice mg daily x2 weeks.  If still having pain after 2 weeks, complete 3rd-week of NSAID. May use remaining NSAID as needed once daily for pain control.  Do not to use additional over-the-counter NSAIDs (ibuprofen, naproxen, Advil, Aleve) while taking prescription NSAIDs.  May use Tylenol [phone] mg 2 to 3 times a day for breakthrough pain.  Patient did not tolerate previously prescribed meloxicam stating that she believes she had palpitations while on medication. - Start HEP for patellar tendinitis  15 additional minutes spent for educating Therapeutic Home Exercise Program.  This included exercises focusing on stretching, strengthening, with focus on eccentric aspects.   Long term goals include an improvement in range of motion, strength, endurance as well as avoiding reinjury. Patient's frequency would include in 1-2 times a day, 3-5 times a week for a duration of 6-12 weeks. Proper technique shown and discussed handout in great detail with ATC.  All questions were discussed and answered.     Pertinent previous records reviewed include none  Follow Up: As needed if no improvement or worsening of symptoms in 3 to 4 weeks.  Could consider x-ray versus ultrasound versus patellar knee strap versus physical therapy   Subjective:   I, Holly Wright, am serving as a Neurosurgeon for Doctor Richardean Sale   Chief Complaint: Right knee swelling   HPI:     02/21/2019 Repeat injection given again today.  Patient did tolerate the procedure well and did have aspiration of a significant amount of straw-colored colored fluid.  Patient does have some instability of the knee and will need to continue to monitor.  Patient is pregnant at the moment to be warned that this medication has not been studied in pregnancy.  Athletic trainer was at bedside during the discussion.  Patient will continue all other conservative therapy and can repeat these injections if necessary but hopefully this will be a good with another 6 to 12 months of improvement.   Update 11/05/2020 Holly Wright is a 43 y.o. female coming in with complaint of R knee pain. Patient states that she is able to cycle but did try jogging for 10 minutes the other week and pain and swelling. Pain over lateral patella. Patient has been using brace and elevation for pain relief.    01/30/2021 Patient states knee is doing better than it has been in the past has been slow and mild swelling for about 10/11 days. Saturday after last visit 11/05/20 had fluid drained off and steroid injection helped knee but had side effects that made her "angry"    04/13/2023 Patient states knee started to feel funny yesterday. Left knee started this morning.     Relevant Historical Information: None pertinen  Additional pertinent review of systems negative.   Current Outpatient Medications:    meloxicam (MOBIC) 15 MG tablet, Take 1 tablet (15 mg total) by mouth daily., Disp: 30 tablet, Rfl: 0   calcium carbonate (TUMS - DOSED IN MG ELEMENTAL CALCIUM) 500 MG chewable  tablet, Chew 1-2 tablets by mouth 3 (three) times daily as needed for indigestion or heartburn., Disp: , Rfl:    meloxicam (MOBIC) 15 MG tablet, Take 1 tablet (15 mg total) by mouth daily., Disp: 30 tablet, Rfl: 0   meloxicam (MOBIC) 15 MG tablet, Take 1 tablet (15 mg total) by mouth daily., Disp: 30 tablet, Rfl: 0   Omega-3 Fatty Acids (FISH OIL PO), Take  1 tablet by mouth daily., Disp: , Rfl:    Prenatal Vit-Fe Fumarate-FA (MULTIVITAMIN-PRENATAL) 27-0.8 MG TABS tablet, Take 1 tablet by mouth daily at 12 noon., Disp: , Rfl:    Vitamin D, Ergocalciferol, (DRISDOL) 1.25 MG (50000 UNIT) CAPS capsule, Take 1 capsule (50,000 Units total) by mouth every 7 (seven) days., Disp: 12 capsule, Rfl: 0   Vitamin D, Ergocalciferol, (DRISDOL) 1.25 MG (50000 UT) CAPS capsule, Take 1 capsule (50,000 Units total) by mouth every 7 (seven) days., Disp: 12 capsule, Rfl: 0   Objective:     Vitals:   04/13/23 1046  BP: 110/80  Pulse: 87  SpO2: 99%  Weight: 158 lb (71.7 kg)  Height: 5\' 5"  (1.651 m)      Body mass index is 26.29 kg/m.    Physical Exam:    General:  awake, alert oriented, no acute distress nontoxic Skin: no suspicious lesions or rashes Neuro:sensation intact and strength 5/5 with no deficits, no atrophy, normal muscle tone Psych: No signs of anxiety, depression or other mood disorder  Bilateral knee: No swelling No deformity Neg fluid wave, joint milking ROM Flex 110, Ext 0 NTTP over the quad tendon, medial fem condyle, lat fem condyle, patella, plica, patella tendon, tibial tuberostiy, fibular head, posterior fossa, pes anserine bursa, gerdy's tubercle, medial jt line, lateral jt line Neg anterior and posterior drawer Neg lachman Neg sag sign Negative varus stress Negative valgus stress Negative McMurray Negative Thessaly No pain with double or single-leg squat No pain with resisted knee extension Gait normal    Electronically signed by:  Holly Wright [facility] 11:02 AM 04/13/23

## 2023-08-04 NOTE — Progress Notes (Unsigned)
    Holly Wright D.[facility] [address] [place] [address] Phone: [phone]   Assessment and Plan:     There are no diagnoses linked to this encounter.  ***   Pertinent previous records reviewed include ***    Follow Up: ***     Subjective:   I, Holly Wright, am serving as a Neurosurgeon for Doctor Holly Wright   Chief Complaint: Right knee swelling   HPI:    02/21/2019 Repeat injection given again today.  Patient did tolerate the procedure well and did have aspiration of a significant amount of straw-colored colored fluid.  Patient does have some instability of the knee and will need to continue to monitor.  Patient is pregnant at the moment to be warned that this medication has not been studied in pregnancy.  Athletic trainer was at bedside during the discussion.  Patient will continue all other conservative therapy and can repeat these injections if necessary but hopefully this will be a good with another 6 to 12 months of improvement.   Update 11/05/2020 Holly Wright is a 43 y.o. female coming in with complaint of R knee pain. Patient states that she is able to cycle but did try jogging for 10 minutes the other week and pain and swelling. Pain over lateral patella. Patient has been using brace and elevation for pain relief.    01/30/2021 Patient states knee is doing better than it has been in the past has been slow and mild swelling for about 10/11 days. Saturday after last visit 11/05/20 had fluid drained off and steroid injection helped knee but had side effects that made her "angry"    04/13/2023 Patient states knee started to feel funny yesterday. Left knee started this morning.   08/05/2023 Patient states     Relevant Historical Information: None pertinen  Additional pertinent review of systems negative.   Current Outpatient Medications:    calcium carbonate (TUMS - DOSED IN MG ELEMENTAL CALCIUM) 500 MG chewable  tablet, Chew 1-2 tablets by mouth 3 (three) times daily as needed for indigestion or heartburn., Disp: , Rfl:    celecoxib  (CELEBREX ) 200 MG capsule, Take 1 capsule (200 mg total) by mouth 2 (two) times daily., Disp: 60 capsule, Rfl: 0   meloxicam  (MOBIC ) 15 MG tablet, Take 1 tablet (15 mg total) by mouth daily., Disp: 30 tablet, Rfl: 0   meloxicam  (MOBIC ) 15 MG tablet, Take 1 tablet (15 mg total) by mouth daily., Disp: 30 tablet, Rfl: 0   Omega-3 Fatty Acids (FISH OIL PO), Take 1 tablet by mouth daily., Disp: , Rfl:    Prenatal Vit-Fe Fumarate-FA (MULTIVITAMIN-PRENATAL) 27-0.8 MG TABS tablet, Take 1 tablet by mouth daily at 12 noon., Disp: , Rfl:    Vitamin D , Ergocalciferol , (DRISDOL ) 1.25 MG (50000 UNIT) CAPS capsule, Take 1 capsule (50,000 Units total) by mouth every 7 (seven) days., Disp: 12 capsule, Rfl: 0   Vitamin D , Ergocalciferol , (DRISDOL ) 1.25 MG (50000 UT) CAPS capsule, Take 1 capsule (50,000 Units total) by mouth every 7 (seven) days., Disp: 12 capsule, Rfl: 0   Objective:     There were no vitals filed for this visit.    There is no height or weight on file to calculate BMI.    Physical Exam:    ***   Electronically signed by:  Holly Wright D.[facility] 7:53 AM 08/04/23

## 2023-08-05 ENCOUNTER — Ambulatory Visit (INDEPENDENT_AMBULATORY_CARE_PROVIDER_SITE_OTHER): Payer: Self-pay | Admitting: Sports Medicine

## 2023-08-05 VITALS — BP 110/80 | HR 90 | Ht 65.0 in | Wt 153.0 lb

## 2023-08-05 DIAGNOSIS — M7741 Metatarsalgia, right foot: Secondary | ICD-10-CM

## 2023-08-05 MED ORDER — CELECOXIB 200 MG PO CAPS: 200.0000 mg | ORAL_CAPSULE | Freq: Two times a day (BID) | ORAL | 0 refills | Status: AC

## 2023-08-05 NOTE — Patient Instructions (Signed)
-   Start Celebrex  200 mg  x2 daily .  If still having pain after 2 weeks, complete 3rd-week of NSAID. May use remaining NSAID as needed once daily for pain control.  Do not to use additional over-the-counter NSAIDs (ibuprofen , naproxen, Advil , Aleve, etc.) while taking prescription NSAIDs.  May use Tylenol  [id number] mg 2 to 3 times a day for breakthrough pain. Recommend soft and supportive tennis shoes  4-5 week follow up

## 2023-08-06 ENCOUNTER — Encounter: Payer: Self-pay | Admitting: Sports Medicine

## 2023-09-02 NOTE — Progress Notes (Deleted)
    [facility] [address] [place] [address] Phone: [phone]   Assessment and Plan:     There are no diagnoses linked to this encounter.  ***   Pertinent previous records reviewed include ***    Follow Up: ***     Subjective:   I, Holly Wright, am serving as a Neurosurgeon for Doctor Morene Mace   Chief Complaint: Right foot pain     HPI:    08/05/2023 Patient is a 43 year old female with right foot pain. Patient states right foot pain she went on a hike in cowboy boots. She has a TTP point that feels like a golf ball on the top of her foot. Was using meloxicam  but doesn't like the side effects but it did help the pain. No radiating pain . No numbness or tingling. Walking normal shoes she can notice the pain and it is harder to walk on. Aspercreme helps with the pain    09/03/2023 Patient states   Relevant Historical Information: None pertinent  Additional pertinent review of systems negative.   Current Outpatient Medications:    calcium carbonate (TUMS - DOSED IN MG ELEMENTAL CALCIUM) 500 MG chewable tablet, Chew 1-2 tablets by mouth 3 (three) times daily as needed for indigestion or heartburn., Disp: , Rfl:    celecoxib  (CELEBREX ) 200 MG capsule, Take 1 capsule (200 mg total) by mouth 2 (two) times daily., Disp: 60 capsule, Rfl: 0   celecoxib  (CELEBREX ) 200 MG capsule, Take 1 capsule (200 mg total) by mouth 2 (two) times daily., Disp: 60 capsule, Rfl: 0   meloxicam  (MOBIC ) 15 MG tablet, Take 1 tablet (15 mg total) by mouth daily., Disp: 30 tablet, Rfl: 0   meloxicam  (MOBIC ) 15 MG tablet, Take 1 tablet (15 mg total) by mouth daily., Disp: 30 tablet, Rfl: 0   Omega-3 Fatty Acids (FISH OIL PO), Take 1 tablet by mouth daily., Disp: , Rfl:    Prenatal Vit-Fe Fumarate-FA (MULTIVITAMIN-PRENATAL) 27-0.8 MG TABS tablet, Take 1 tablet by mouth daily at 12 noon., Disp: , Rfl:    Vitamin D , Ergocalciferol , (DRISDOL ) 1.25 MG  (50000 UNIT) CAPS capsule, Take 1 capsule (50,000 Units total) by mouth every 7 (seven) days., Disp: 12 capsule, Rfl: 0   Vitamin D , Ergocalciferol , (DRISDOL ) 1.25 MG (50000 UT) CAPS capsule, Take 1 capsule (50,000 Units total) by mouth every 7 (seven) days., Disp: 12 capsule, Rfl: 0   Objective:     There were no vitals filed for this visit.    There is no height or weight on file to calculate BMI.    Physical Exam:    ***   Electronically signed by:  Odis Mace [facility] 7:39 AM 09/02/23

## 2023-09-03 ENCOUNTER — Ambulatory Visit: Payer: Self-pay | Admitting: Sports Medicine

## 2023-09-03 ENCOUNTER — Other Ambulatory Visit: Payer: Self-pay | Admitting: Sports Medicine

## 2023-09-03 MED ORDER — METHYLPREDNISOLONE 4 MG PO TBPK: ORAL_TABLET | ORAL | 0 refills | Status: AC

## 2023-09-03 NOTE — Telephone Encounter (Signed)
 Prednisone placed

## 2023-09-03 NOTE — Progress Notes (Signed)
 Prednisone sent in.

## 2023-09-03 NOTE — Telephone Encounter (Signed)
 Called pt, states per last visit she was to try the anti inflammatory for a few weeks and if it did not help that Dr. Leonce would call in oral steroids.  If he agrees to this, she would like to move out today's appt to see if this helps.

## 2023-09-15 NOTE — Progress Notes (Unsigned)
    [name] [facility] [address] [place] [address] Phone: [phone]   Assessment and Plan:     There are no diagnoses linked to this encounter.  ***   Pertinent previous records reviewed include ***    Follow Up: ***     Subjective:   I, Holly Wright, am serving as a Neurosurgeon for Doctor Morene Mace   Chief Complaint: Right foot pain     HPI:    08/05/2023 Patient is a 43 year old female with right foot pain. Patient states right foot pain she went on a hike in cowboy boots. She has a TTP point that feels like a golf ball on the top of her foot. Was using meloxicam  but doesn't like the side effects but it did help the pain. No radiating pain . No numbness or tingling. Walking normal shoes she can notice the pain and it is harder to walk on. Aspercreme helps with the pain    09/16/2023 Patient states   Relevant Historical Information: None pertinent  Additional pertinent review of systems negative.   Current Outpatient Medications:    calcium carbonate (TUMS - DOSED IN MG ELEMENTAL CALCIUM) 500 MG chewable tablet, Chew 1-2 tablets by mouth 3 (three) times daily as needed for indigestion or heartburn., Disp: , Rfl:    celecoxib  (CELEBREX ) 200 MG capsule, Take 1 capsule (200 mg total) by mouth 2 (two) times daily., Disp: 60 capsule, Rfl: 0   celecoxib  (CELEBREX ) 200 MG capsule, Take 1 capsule (200 mg total) by mouth 2 (two) times daily., Disp: 60 capsule, Rfl: 0   meloxicam  (MOBIC ) 15 MG tablet, Take 1 tablet (15 mg total) by mouth daily., Disp: 30 tablet, Rfl: 0   meloxicam  (MOBIC ) 15 MG tablet, Take 1 tablet (15 mg total) by mouth daily., Disp: 30 tablet, Rfl: 0   methylPREDNISolone  (MEDROL  DOSEPAK) 4 MG TBPK tablet, Take 6 tablets on day 1.  Take 5 tablets on day 2.  Take 4 tablets on day 3.  Take 3 tablets on day 4.  Take 2 tablets on day 5.  Take 1 tablet on day 6., Disp: 21 tablet, Rfl: 0   Omega-3 Fatty Acids (FISH  OIL PO), Take 1 tablet by mouth daily., Disp: , Rfl:    Prenatal Vit-Fe Fumarate-FA (MULTIVITAMIN-PRENATAL) 27-0.8 MG TABS tablet, Take 1 tablet by mouth daily at 12 noon., Disp: , Rfl:    Vitamin D , Ergocalciferol , (DRISDOL ) 1.25 MG (50000 UNIT) CAPS capsule, Take 1 capsule (50,000 Units total) by mouth every 7 (seven) days., Disp: 12 capsule, Rfl: 0   Vitamin D , Ergocalciferol , (DRISDOL ) 1.25 MG (50000 UT) CAPS capsule, Take 1 capsule (50,000 Units total) by mouth every 7 (seven) days., Disp: 12 capsule, Rfl: 0   Objective:     There were no vitals filed for this visit.    There is no height or weight on file to calculate BMI.    Physical Exam:    ***   Electronically signed by:  Odis Mace [facility] 9:46 AM 09/15/23

## 2023-09-16 ENCOUNTER — Ambulatory Visit: Payer: Self-pay | Admitting: Sports Medicine

## 2023-09-16 ENCOUNTER — Other Ambulatory Visit: Payer: Self-pay

## 2023-09-16 VITALS — HR 81 | Ht 65.0 in | Wt 153.0 lb

## 2023-09-16 DIAGNOSIS — M7741 Metatarsalgia, right foot: Secondary | ICD-10-CM

## 2023-09-16 DIAGNOSIS — G5761 Lesion of plantar nerve, right lower limb: Secondary | ICD-10-CM

## 2023-09-16 NOTE — Patient Instructions (Signed)
 3 week follow up.

## 2023-10-06 NOTE — Progress Notes (Signed)
 [facility] [address] [place] [address] Phone: [phone]   Assessment and Plan:     1. 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  Differential includes metatarsalgia versus Morton's neuroma - Due to failure to improve despite >6 weeks of conservative therapy, pain at times >6/10, pain with day-to-day activities, recommend further evaluation with MRI - Continue activity as tolerated and supportive and comfortable footwear with goal of pain-free ambulation - No significant relief with prior use of Celebrex  and prednisone course   Pertinent previous records reviewed include none  Follow Up: 5 days after MRI to review results and discuss treatment plan.     Subjective:   I, Holly Wright, am serving as a Neurosurgeon for Doctor Morene Mace   Chief Complaint: Right foot pain     HPI:    08/05/2023 Patient is a 43 year old female with right foot pain. Patient states right foot pain she went on a hike in cowboy boots. She has a TTP point that feels like a golf ball on the top of her foot. Was using meloxicam  but doesn't like the side effects but it did help the pain. No radiating pain . No numbness or tingling. Walking normal shoes she can notice the pain and it is harder to walk on. Aspercreme helps with the pain    09/16/2023 Patient states feels like the marble in her foot has grown   10/07/2023 Patient states that she was feeling better for about 2 days post-injection. Patient said pain started to come back over past 2 days but not to the same degree it was initially.   Relevant Historical Information: None pertinent    Additional  pertinent review of systems negative.   Current Outpatient Medications:    celecoxib  (CELEBREX ) 200 MG capsule, Take 1 capsule (200 mg total) by mouth 2 (two) times daily., Disp: 60 capsule, Rfl: 0   celecoxib  (CELEBREX ) 200 MG capsule, Take 1 capsule (200 mg total) by mouth 2 (two) times daily., Disp: 60 capsule, Rfl: 0   meloxicam  (MOBIC ) 15 MG tablet, Take 1 tablet (15 mg total) by mouth daily., Disp: 30 tablet, Rfl: 0   meloxicam  (MOBIC ) 15 MG tablet, Take 1 tablet (15 mg total) by mouth daily., Disp: 30 tablet, Rfl: 0   methylPREDNISolone  (MEDROL  DOSEPAK) 4 MG TBPK tablet, Take 6 tablets on day 1.  Take 5 tablets on day 2.  Take 4 tablets on day 3.  Take 3 tablets on day 4.  Take 2 tablets on day 5.  Take 1 tablet on day 6., Disp: 21 tablet, Rfl: 0   Omega-3 Fatty Acids (FISH OIL PO), Take 1 tablet by mouth daily., Disp: , Rfl:    Vitamin D , Ergocalciferol , (DRISDOL ) 1.25 MG (50000 UT) CAPS capsule, Take 1 capsule (50,000 Units total) by mouth every 7 (seven) days., Disp: 12 capsule, Rfl: 0   calcium carbonate (TUMS - DOSED IN MG ELEMENTAL CALCIUM) 500 MG chewable tablet, Chew 1-2 tablets by mouth 3 (three) times daily as needed for indigestion or heartburn., Disp: , Rfl:    Prenatal Vit-Fe Fumarate-FA (MULTIVITAMIN-PRENATAL) 27-0.8 MG TABS tablet, Take 1 tablet by mouth daily at 12 noon., Disp: , Rfl:  Vitamin D , Ergocalciferol , (DRISDOL ) 1.25 MG (50000 UNIT) CAPS capsule, Take 1 capsule (50,000 Units total) by mouth every 7 (seven) days., Disp: 12 capsule, Rfl: 0   Objective:     Vitals:   10/07/23 1012  BP: 114/84  Pulse: 80  SpO2: 99%  Weight: 154 lb (69.9 kg)  Height: 5' 5 (1.651 m)      Body mass index is 25.63 kg/m.    Physical Exam:    Gen: Appears well, nad, nontoxic and pleasant Psych: Alert and oriented, appropriate mood and affect Neuro: sensation intact, strength is 5/5 with df/pf/inv/ev, muscle tone wnl Skin: no susupicious lesions or rashes   Right  foot/ankle:  No deformity, no swelling or effusion TTP mildly at the 2nd and 3rd metatarsal heads Mild discomfort with squeeze test at metatarsal heads NTTP over fibular head, lat mal, medial mal, achilles, navicular, base of 5th, ATFL, CFL, deltoid, calcaneous or midfoot ROM DF 30, PF 45, inv/ev intact Negative ant drawer, talar tilt, rotation test, tibiofibular squeeze test. Neg thompson No pain with resisted inversion or eversion    Electronically signed by:  Odis Mace [facility] 10:33 AM 10/07/23

## 2023-10-07 ENCOUNTER — Ambulatory Visit: Payer: Self-pay | Admitting: Sports Medicine

## 2023-10-07 VITALS — BP 114/84 | HR 80 | Ht 65.0 in | Wt 154.0 lb

## 2023-10-07 DIAGNOSIS — M7741 Metatarsalgia, right foot: Secondary | ICD-10-CM

## 2023-10-07 DIAGNOSIS — G5761 Lesion of plantar nerve, right lower limb: Secondary | ICD-10-CM

## 2023-10-07 NOTE — Patient Instructions (Signed)
 R foot MRI   Follow up 5 days after to discuss results
